# Patient Record
Sex: Female | Born: 1995 | Race: White | Hispanic: No | Marital: Single | Smoking: Never smoker
Health system: Southern US, Community
[De-identification: ages and names within clinical notes are randomized; demographics above are authoritative.]

## PROBLEM LIST (undated history)

## (undated) DIAGNOSIS — G44219 Episodic tension-type headache, not intractable: Secondary | ICD-10-CM

## (undated) DIAGNOSIS — G43009 Migraine without aura, not intractable, without status migrainosus: Secondary | ICD-10-CM

## (undated) DIAGNOSIS — F419 Anxiety disorder, unspecified: Secondary | ICD-10-CM

## (undated) DIAGNOSIS — S060X0A Concussion without loss of consciousness, initial encounter: Secondary | ICD-10-CM

## (undated) DIAGNOSIS — F0781 Postconcussional syndrome: Secondary | ICD-10-CM

## (undated) HISTORY — DX: Anxiety disorder, unspecified: F41.9

## (undated) HISTORY — DX: Episodic tension-type headache, not intractable: G44.219

## (undated) HISTORY — DX: Postconcussional syndrome: F07.81

## (undated) HISTORY — DX: Migraine without aura, not intractable, without status migrainosus: G43.009

## (undated) HISTORY — PX: TYMPANOSTOMY TUBE PLACEMENT: SHX32

## (undated) HISTORY — DX: Concussion without loss of consciousness, initial encounter: S06.0X0A

---
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
## 2013-03-09 ENCOUNTER — Telehealth: Payer: Self-pay

## 2013-03-09 DIAGNOSIS — G43009 Migraine without aura, not intractable, without status migrainosus: Secondary | ICD-10-CM

## 2013-03-09 MED ORDER — SUMATRIPTAN SUCCINATE 50 MG PO TABS: ORAL_TABLET | ORAL | Status: DC

## 2013-03-09 NOTE — Telephone Encounter (Signed)
Rx was sent electronically. TG 

## 2013-03-09 NOTE — Telephone Encounter (Signed)
Darl Pikes, mom, lvm asking for refills on child's Sumatriptan. i called mom and scheduled a f/u visit. Told her to check with pharmacy later today or tomorrow. She expressed understanding.

## 2013-03-26 ENCOUNTER — Encounter: Payer: Self-pay | Admitting: Family

## 2013-03-26 ENCOUNTER — Ambulatory Visit (INDEPENDENT_AMBULATORY_CARE_PROVIDER_SITE_OTHER): Payer: BC Managed Care – PPO | Admitting: Family

## 2013-03-26 VITALS — BP 110/72 | HR 78 | Ht 64.75 in | Wt 130.6 lb

## 2013-03-26 DIAGNOSIS — F0781 Postconcussional syndrome: Secondary | ICD-10-CM

## 2013-03-26 DIAGNOSIS — G43009 Migraine without aura, not intractable, without status migrainosus: Secondary | ICD-10-CM

## 2013-03-26 DIAGNOSIS — S060X0S Concussion without loss of consciousness, sequela: Secondary | ICD-10-CM

## 2013-03-26 DIAGNOSIS — G44219 Episodic tension-type headache, not intractable: Secondary | ICD-10-CM

## 2013-03-26 NOTE — Progress Notes (Signed)
Patient: Whitney Morgan MRN: [id number] Sex: female DOB: 1996/03/03  Provider: Elveria Rising, NP Location of Care: [facility]  Note type: Routine return visit  History of Present Illness: Referral Source: Dr. Loyola Mast History from: patient, [facility] chart and parent. Chief Complaint: Migraines  Whitney Morgan is a 17 y.o. female with history of headaches as well as concussion and post concussion syndrome in September, 2011. MIgraines developed after the closed head injury. [name] and her mother have identified triggers for her headaches as missing meals, not drinking sufficient water, stress and her menstrual cycle. She was started on Zoloft by her primary care physician and feels that it has helped with headaches as well as anxiety.  When Donnice has a migraine, if she takes Sumatriptan at the onset, it tends to abort the migraine fairly quickly. If there is a delay, unfortunately, the migraine builds and she develops vomiting and she has pain and vomiting for hours.Chantavia used to take Topiramate for prevention of migraines but experienced a 10 lb weight loss so the medication was stopped. She tried Riboflavin and Magnesium but has problems with Irritable Bowel Syndrome and could not tolerate them. Her PCP was reluctant for her to try Feverfew or Butterburr because of her history of IBS. When she was last seen in March, 2014, she was interested in trying another preventative as she was experiencing migraines that were interfering with school. She was given a prescription for Atenolol but didn't start it because at the same time, she saw her optometrist and found that she needed glasses. She said that wearing glasses made a big improvement in her headaches.   Unfortunately, Whitney Morgan tells me today that she had a closed head injury 5 weeks ago while playing soccer. She said that she collided with a female player, who knocked her to the ground and her head made contact with the  ground. She said that she did not lose consciousness, was able to sit up but was dizzy. She was assisted to her feet and her position changed to goalie so she would not have to run. She said that had a headache but that she finished the game. That evening she had a headache and vomiting all night. She had pain in the back of her neck and base of her skull. She took Imitrex but it gave no relief. The next morning she tried a second dose and that dose gave some relief. That evening, around 11pm, her roommate noted that her pupils were unequal and she went to local ER. She said that she was assessed and given Ibuprofen 800mg  and sent home. She said that for the next 4 weeks she had frequent severe headaches at base of her skull, but no more vomiting. She had some trouble getting school work done but because of headache pain. She said that the last headache she had was 5 days ago. She is having some pain between her shoulder blades today but says that is muscular in nature and has been present for the past 2 weeks, not since the head injury.  Review of Systems: 12 system review was remarkable for easy bruising, headache, anxiety and decreased energy.  No past medical history on file. Hospitalizations: no, Head Injury: yes, Nervous System Infections: no, Immunizations up to date: yes Past Medical History Comments: Irritable bowel syndrome, migraines, anxiety  Surgical History Past Surgical History  Procedure Laterality Date  . Tonsillectomy  2006  . Tympanostomy tube placement  age 19 months    Family History family history is not on file. Her mother had migraines as a teenager and says that they now occur with menstrual cycles. Family History is negative for seizures, cognitive impairment, blindness, deafness, birth defects, chromosomal disorder, autism.  Social History History   Social History  . Marital Status: Single    Spouse Name: N/A    Number of Children: N/A  . Years of Education:  N/A   Social History Main Topics  . Smoking status: Never Smoker   . Smokeless tobacco: Never Used  . Alcohol Use: No  . Drug Use: No  . Sexual Activity: Not on file   Other Topics Concern  . Not on file   Social History Narrative  . No narrative on file   Educational level: SophomoreSchool Attending: [employer]. Occupation: Consulting civil engineer Living with roommate  Hobbies/Interest: Soccer School comments: doing well in college  Allergies  Allergen Reactions  . Abreva [Docosanol]   . Neosporin [Neomycin-Bacitracin Zn-Polymyx]     Physical Exam BP 110/72  Pulse 78  Ht 5' 4.75" (1.645 m)  Wt 130 lb 9.6 oz (59.24 kg)  BMI 21.89 kg/m2 General: alert, well developed, well nourished, in no acute distress right-handed, blond hair, blue eyes Head: normocephalic, no dysmorphic features Ears, Nose and Throat: Otoscopic: tympanic membranes normal .  Pharynx: oropharynx is pink without exudates or tonsillar hypertrophy. Neck: supple, full range of motion, no cranial or cervical bruits Respiratory: auscultation clear Cardiovascular: no murmurs, pulses are normal Musculoskeletal: no skeletal deformities or apparent scoliosis. She complains of some tightness and soreness in the muscles between her shoulder blades today. Skin: no rashes or neurocutaneous lesions  Neurologic Exam  Mental Status: alert; oriented to person, place, and year; knowledge is normal for age; language is normal Cranial Nerves: visual fields are full to double simultaneous stimuli; extraocular movements are full and conjugate; pupils are round reactive to light; funduscopic examination shows sharp disc margins with normal vessels; symmetric facial strength; midline tongue and uvula;hearing is intact and symmetric Motor: Normal strength, tone, and mass; good fine motor movements; no pronator drift. Sensory: intact responses to touch and temperature Coordination: good finger-to-nose, rapid repetitive  alternating movements and finger apposition   Gait and Station: normal gait and station; patient is able to walk on heels, toes and tandem without difficulty; balance is adequate; Romberg exam is negative; Gower response is negative Reflexes: symmetric and diminished bilaterally; no clonus; bilateral flexor plantar responses   Assessment and Plan Domitila is a 17 year old young woman with history of headaches as well as concussion and post concussion syndrome in September, 2011. She recently had closed head injury again 5 weeks ago while playing soccer. Fortunately her headaches seem to be resolving from this injury. I talked with Konrad Felix about this head injury and about the concerns with repeated head injuries. I will see Madelin back in 6 months or sooner if needed.

## 2013-03-28 ENCOUNTER — Encounter: Payer: Self-pay | Admitting: Family

## 2013-03-28 DIAGNOSIS — G44219 Episodic tension-type headache, not intractable: Secondary | ICD-10-CM | POA: Insufficient documentation

## 2013-03-28 DIAGNOSIS — F0781 Postconcussional syndrome: Secondary | ICD-10-CM | POA: Insufficient documentation

## 2013-03-28 DIAGNOSIS — G43009 Migraine without aura, not intractable, without status migrainosus: Secondary | ICD-10-CM | POA: Insufficient documentation

## 2013-03-28 DIAGNOSIS — S060X0A Concussion without loss of consciousness, initial encounter: Secondary | ICD-10-CM | POA: Insufficient documentation

## 2013-03-28 NOTE — Patient Instructions (Signed)
Continue your medications without change.  Let me know if your headaches from your recent head injury do not improve or if you develop other problems. Please plan to return for follow up in 6 months or sooner if needed.

## 2013-04-20 ENCOUNTER — Other Ambulatory Visit: Payer: Self-pay | Admitting: Family

## 2013-09-28 ENCOUNTER — Other Ambulatory Visit: Payer: Self-pay | Admitting: Family

## 2013-10-18 ENCOUNTER — Encounter: Payer: Self-pay | Admitting: Family

## 2013-10-18 ENCOUNTER — Ambulatory Visit (INDEPENDENT_AMBULATORY_CARE_PROVIDER_SITE_OTHER): Payer: BC Managed Care – PPO | Admitting: Family

## 2013-10-18 VITALS — BP 114/70 | HR 76 | Ht 64.25 in | Wt 132.6 lb

## 2013-10-18 DIAGNOSIS — F0781 Postconcussional syndrome: Secondary | ICD-10-CM

## 2013-10-18 DIAGNOSIS — G44219 Episodic tension-type headache, not intractable: Secondary | ICD-10-CM

## 2013-10-18 DIAGNOSIS — S060X0A Concussion without loss of consciousness, initial encounter: Secondary | ICD-10-CM

## 2013-10-18 DIAGNOSIS — G43009 Migraine without aura, not intractable, without status migrainosus: Secondary | ICD-10-CM

## 2013-10-18 MED ORDER — SUMATRIPTAN SUCCINATE 50 MG PO TABS: ORAL_TABLET | ORAL | Status: DC

## 2013-10-18 NOTE — Progress Notes (Signed)
Patient: Whitney Morgan MRN: [id number] Sex: female DOB: 1996-04-20  Provider: Elveria Morgan, Whitney Yeh, NP Location of Care: [facility]  Note type: Routine return visit  History of Present Illness: Referral Source: Dr. Loyola MastMelissa Morgan History from: patient and her mother Chief Complaint: Migraines  Whitney Morgan is a 18 y.o. young woman with history of tension and migraine headaches. Whitney Morgan also has history of concussion and post concussion syndrome in September, 2011. MIgraines developed after the closed head injury. Unfortunately she had another closed head injury in September of 2014 when she collided with another player during soccer. She had headaches vomiting afterwards, then had ongoing headaches along with some neck and shoulder soreness for several weeks afterwards. Fortunately, she recovered well from that event and has had no further problems. She is no longer playing soccer.   Whitney Morgan and her mother have identified triggers for her headaches as missing meals, not drinking sufficient water, stress and her menstrual cycle. She was started on Zoloft by her primary care physician and feels that it has helped with headaches as well as anxiety. Whitney Morgan is in college in a demanding program at [employer] and tells me today that her headaches have not been problematic since last seen in October 2014. She says that as long as she takes Sumatriptan at the onset, it tends to abort the migraine fairly quickly. She said that she had a migraine several weeks ago that progressed to vomiting but says that she had skipped meals and it occurred on the first day of her menstrual cycle. She says that she has worked her college schedule around meal times and makes sure that she gets plenty of sleep, and overall, she has done well other than that episode.   Review of Systems: 12 system review was remarkable for headaches  Past Medical History  Diagnosis Date  . Migraine without aura,  without mention of intractable migraine without mention of status migrainosus   . Episodic tension type headache   . Post concussion syndrome   . Concussion with no loss of consciousness     September 2011 and September 2014  . Anxiety 2014   Hospitalizations: no, Head Injury: no, Nervous System Infections: no, Immunizations up to date: yes Past Medical History Comments: See hx.  Surgical History Past Surgical History  Procedure Laterality Date  . Tonsillectomy  2006  . Tympanostomy tube placement      age 649 months    Family History family history includes Crohn's disease in her father. Family History is otherwise negative for migraines, seizures, cognitive impairment, blindness, deafness, birth defects, chromosomal disorder, autism.  Social History History   Social History  . Marital Status: Single    Spouse Name: N/A    Number of Children: N/A  . Years of Education: N/A   Social History Main Topics  . Smoking status: Never Smoker   . Smokeless tobacco: Never Used  . Alcohol Use: No  . Drug Use: No  . Sexual Activity: No   Other Topics Concern  . None   Social History Narrative  . None   Educational level: university School Attending:[employer]. 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 Living with:  room-mate(during school)  Hobbies/Interest: Reading School comments:  Whitney Morgan is doing well in school. She is also a [employer] this summer.  Physical Exam BP 114/70  Pulse 76  Ht 5' 4.25" (1.632 m)  Wt 132 lb 9.6 oz (60.147 kg)  BMI 22.58 kg/m2  LMP 10/16/2013 General: alert, well developed, well nourished, in no acute distress right-handed, blond hair, blue eyes  Head: normocephalic, no dysmorphic features  Ears, Nose and Throat: Otoscopic: tympanic membranes normal . Pharynx: oropharynx is pink without exudates or tonsillar hypertrophy.  Neck: supple, full range of motion, no cranial or cervical bruits  Respiratory:  auscultation clear  Cardiovascular: no murmurs, pulses are normal  Musculoskeletal: no skeletal deformities or apparent scoliosis.  Skin: no rashes or neurocutaneous lesions   Neurologic Exam  Mental Status: alert; oriented to person, place, and year; knowledge is normal for age; language is normal  Cranial Nerves: visual fields are full to double simultaneous stimuli; extraocular movements are full and conjugate; pupils are round reactive to light; funduscopic examination shows sharp disc margins with normal vessels; symmetric facial strength; midline tongue and uvula;hearing is intact and symmetric  Motor: Normal strength, tone, and mass; good fine motor movements; no pronator drift.  Sensory: intact responses to touch and temperature  Coordination: good finger-to-nose, rapid repetitive alternating movements and finger apposition  Gait and Station: normal gait and station; patient is able to walk on heels, toes and tandem without difficulty; balance is adequate; Romberg exam is negative; Gower response is negative  Reflexes: symmetric and diminished bilaterally; no clonus; bilateral flexor plantar responses   Assessment and Plan Whitney Morgan is a 18 year old young woman with history of headaches as well as concussion and post concussion syndrome in September, 2011. She had a repeat closed head injury in September 2014 but has recovered well from that. Whitney Morgan is managing her headaches well at this time. I talked with her about stress management, and continuing to make good choices in terms of lifestyle - not skipping meals, being well hydrated and getting adequate sleep. I will see Whitney Morgan back in follow up when she returns from college in December or sooner if needed.

## 2013-10-20 ENCOUNTER — Encounter: Payer: Self-pay | Admitting: Family

## 2013-10-20 NOTE — Patient Instructions (Signed)
Continue your medications without change. Let me know if your headaches worsen or if you have other concerns. Please plan to return for follow up in December 2015 when you return from college for winter break.

## 2013-12-15 ENCOUNTER — Telehealth: Payer: Self-pay | Admitting: Family

## 2013-12-15 NOTE — Telephone Encounter (Signed)
[facility] [place], [place] is in [place] that her insurance required prior authorization for quantity limit exception for Sumatriptan. In reviewing her record, I did not see that she would need that so I called Whitney Morgan to get more information and left a message for her to call me back. Her mother Whitney Morgan called me and said that Whitney Morgan was in class and had asked her to call me instead. She said that Whitney Morgan had left her medication at home and had tried to [place] in [place], which is likely what prompted the pharmacy issue. She said that as far as she knew that Whitney Morgan had not exceeded more than 6 tablets per month. However, she said that Whitney Morgan had asked her to talk with me about her increase in severe headaches around her menstrual cycles. She said that for the last couple of months that Whitney Morgan had severe migraines with vomiting on the 1st and 2nd day of her period. She took Sumatriptan and Tylenol with those migraines but still had vomiting. Whitney Morgan was interested in knowing if a birth control pill would help to regulate her periods and perhaps minimize the migraines. Mom said that Whitney Morgan had some other migraines during the month but that they were not as severe. She said that she had talked with Whitney Morgan about a headache that she had yesterday in which she did not have medication with her and there was a delay in treatment. I talked with Mom about this and asked her to also remind Whitney Morgan about the need for her to avoid skipping meals and to be adequately hydrated. Mom also asked if Whitney Morgan could reconsider starting Atenolol if she was having more migraines during the month as well as around her period and I said that we could, but that we needed for her to keep track of her headaches so that we could determine how many she was having and if the medication was benefiting her. We also talked about increasing the monthly allotment of Sumatriptan from 6  tablets per month to 8 tablets per month. I agreed with that but again stressed the need to know how many migraines Whitney Morgan was experiencing. Mom agreed and said that she would talk with Whitney Morgan this evening and call me back tomorrow. Whitney Morgan

## 2013-12-16 NOTE — Telephone Encounter (Signed)
The mother called Inetta Fermoina back. She stated she wanted the pt to try the pill and will the family doctor for the pt to be seen or the GYN. The mother will let you know about that. The mother mentioned Imitrex - the pt  would misuse the medication because of the severity of her headaches, mostly around her menstral cycle. Can you can call  [facility] and can pick up the medication tomorrow and take it to the pt. She can be reached at [phone].

## 2013-12-17 MED ORDER — SUMATRIPTAN SUCCINATE 50 MG PO TABS: ORAL_TABLET | ORAL | Status: DC

## 2013-12-17 NOTE — Telephone Encounter (Signed)
I sent updated Rx to [facility]. TG

## 2013-12-22 ENCOUNTER — Telehealth: Payer: Self-pay | Admitting: *Deleted

## 2013-12-22 NOTE — Telephone Encounter (Signed)
I reviewed your notes and agree with this plan. 

## 2013-12-22 NOTE — Telephone Encounter (Signed)
[name], mom, stated the pt is still having headaches and getting worse. She stated that the pt and herself went to see their family doctor on 11/20/13. The pt was prescribed birth control, Mircette. The mother stated the pt started taking the birth control on Monday. The mother stated the pt is no longer feeling the headaches on the forehead area and on the face. The headache has moved to the top back of her head. The mother stated the pt is getting headaches daily. The mother said that the pt is [place] the end of this month. The pt is staying with the mother's in-law's. The mother stated that the pt is not eating "great", not exercising, and is trying to stay hydrated. The mother stated the pt alternates with advil and tylenol, but no relief. The mother would like to know if she should give the birth control some time to work or should she give the pt something else for her headaches. The mother also wanted to know if the pt should come in to see you. The mother can be reached at [phone]

## 2013-12-22 NOTE — Telephone Encounter (Signed)
I called Mom. She said that [facility] of headaches every day, some worse than others. Yesterday she left class to vomit, then returned to class because she had a presentation to do. She said that [name] denies being stressed but says that she doesn't like the teacher or the class. The headaches have been worse since she has been back to school. Her father wonders if she is depressed because he feels that she sleeps too much and has a negative demeanor (says nothing is every good etc) and wants her to go to counseling. Mom said that she is eating meals because she is staying with grandparents, and that she says that she is drinking water. She has not been exercising. I talked with Mom about the daily Advil or Tylenol and told her about medication rebound headaches. I told her that we could try low dose Tizanidine to help her to stop taking so much daily analgesic medication if she had headaches that prevented her from sleep at night but that she needed to stop taking the medication every day. I told her that [name] was likely more stressed with school than she was aware or wanted to admit, and that she might be depressed. Counseling may help. She is on Setraline. She may need to be on migraine preventative but [name] has been resistant to that in the past. I told Mom that [facility] needed to come in for follow up so that we could make a treatment plan. Mom said that she would talk with [facility] and call me back as [name] may not be willing to come in until she finishes her semester at the end of this month. I told Mom that I would be willing to work [employer] in at a time that she could come in.  TG

## 2014-01-07 ENCOUNTER — Telehealth: Payer: Self-pay | Admitting: Family

## 2014-01-07 MED ORDER — SUMATRIPTAN SUCCINATE 50 MG PO TABS: ORAL_TABLET | ORAL | Status: DC

## 2014-01-07 NOTE — Telephone Encounter (Signed)
Mom Whitney HeftySusie Morgan called with update on [place]atelyn. See previous phone calls 12/15/13 and 12/22/13. Mom talked with her and told her not to take so much OTC pain relievers as I had advised. She said that [facility] tolerated that. She is having about 2 bad headaches per week.  She is due to have her menstrual period next week and both she and Mom are anxious to see how migraines are with that now that she is on a birth control pill. Mom said that Whitney FelixKatelyn has only been given 6 Imitrex per month in the past and that she and I talked about increasing that to 8 per month in a prior conversation. Mom wants to consider that now that Whitney FelixKatelyn is taking fewer OTC pain relievers and having 2 bad headaches per week. Mom's number is [phone]. TG

## 2014-01-07 NOTE — Telephone Encounter (Signed)
I reviewed your notes and agree with this plan. 

## 2014-01-07 NOTE — Telephone Encounter (Signed)
I called and talked to Mom. I told her that I would be willing to increase her Imitrex month's supply to 8 tablets but that I was concerned about the number of migraines that Whitney Morgan is having overall. I told Mom that while Whitney Morgan has been resistent to taking a preventative medication, that she needs to reconsider that and needs to come in for an appointment in the next week or so. Mom agreed, and said that she would talk with her this weekend and call me next week to schedule. She said that Whitney Morgan would have a week or so off between summer and fall semester and moving into a new apartment, and [facility] would [facility]. I will send in updated Rx now and await Mom's call back next week. TG

## 2014-01-17 NOTE — Telephone Encounter (Signed)
I called and scheduled Whitney Morgan for an appointment Weds August 12th @ 8:45AM, to arrive @ 8:30AM. TG

## 2014-01-17 NOTE — Telephone Encounter (Signed)
[name], mom, stated the pt is home today through Thursday at noon. The mother stated she would like the pt to be seen this week. The pt will start school next Tuesday and also has fridays off. The mother can be reached at [phone].

## 2014-01-19 ENCOUNTER — Ambulatory Visit (INDEPENDENT_AMBULATORY_CARE_PROVIDER_SITE_OTHER): Payer: BC Managed Care – PPO | Admitting: Family

## 2014-01-19 ENCOUNTER — Encounter: Payer: Self-pay | Admitting: Family

## 2014-01-19 VITALS — BP 110/70 | HR 74 | Ht 64.5 in | Wt 137.2 lb

## 2014-01-19 DIAGNOSIS — S060X0D Concussion without loss of consciousness, subsequent encounter: Secondary | ICD-10-CM

## 2014-01-19 DIAGNOSIS — S060X0A Concussion without loss of consciousness, initial encounter: Secondary | ICD-10-CM

## 2014-01-19 DIAGNOSIS — G44219 Episodic tension-type headache, not intractable: Secondary | ICD-10-CM

## 2014-01-19 DIAGNOSIS — G43009 Migraine without aura, not intractable, without status migrainosus: Secondary | ICD-10-CM

## 2014-01-19 DIAGNOSIS — Z5189 Encounter for other specified aftercare: Secondary | ICD-10-CM

## 2014-01-19 DIAGNOSIS — F0781 Postconcussional syndrome: Secondary | ICD-10-CM

## 2014-01-19 MED ORDER — TIZANIDINE HCL 4 MG PO TABS: ORAL_TABLET | ORAL | Status: DC

## 2014-01-19 MED ORDER — QUDEXY XR 25 MG PO CS24: EXTENDED_RELEASE_CAPSULE | ORAL | Status: DC

## 2014-01-19 MED ORDER — ONDANSETRON HCL 4 MG PO TABS: 4.0000 mg | ORAL_TABLET | Freq: Three times a day (TID) | ORAL | Status: DC | PRN

## 2014-01-19 NOTE — Patient Instructions (Addendum)
I have given you a prescription for Qudexy XR. This is extended release Topiramate. Take 1 per day and let me know if you have side effects as well as how your headaches respond. We may need to adjust the dose if you tolerate the medication. I have given you a copay card for the first month free. After that, we can switch to generic Topiramate ER or use the other copay card and continue on the brand Qudexy, depending on your insurance coverage.   I have given you a prescription for Tizanidine 4mg . Take 1/2 to 1 tablet at bedtime when you have a migraine that keeps you awake at night. This will make you sleepy if you take it during the day, so do not take it during the day if you have to drive or go to class, etc. You can take it with other medications such as Imitrex, Excedrin, Ondansetron etc and it will not interfere.   I have given you a prescription for Ondansetron for nausea to take with you have a migraine with nausea.   Keep track of your headaches so that we can determine if the Topiramate is giving you benefit.  Call me and let me know by the end of the first month how you are doing.   I will see you back in follow up when you are home for winter break or sooner if needed.

## 2014-01-19 NOTE — Progress Notes (Signed)
Patient: Whitney Morgan MRN: [id number] Sex: female DOB: 12/26/1995  Provider: Elveria Rising, NP Location of Care: [facility]  Note type: Routine return visit  History of Present Illness: Referral Source: Dr. Loyola Mast History from: patient and her mother Chief Complaint: Headaches  Whitney Morgan is a 18 y.o. young woman with history of tension and migraine headaches. Dahlia also has history of concussion and post concussion syndrome in September, 2011, with migraines developing after the closed head injury. She had a second head injury in September 2014 when she collided with another player during soccer. She had headaches and vomiting afterwards, then ongoing headaches, neck and shoulder soreness for several weeks afterwards. She was last seen Oct 18, 2013.   Whitney Morgan returns today for follow up because she has had increase in migraines this summer. She has been having migraines on average 3 times per week that includes vomiting. She has had other migraines that are less severe, as well as daily tension headaches. She has been attending summer school at [employer], and denies that it was a stressful experience. She lived with her grandparents this summer, had regular meals, says that she drank enough water and got adequate sleep. Whitney Morgan has had migraines associated with her menstrual cycle for some time, and she began taking an oral contraceptive this summer. Her mother called me several times this summer to report that Whitney Morgan was having increased migraines and taking Ibuprofen or Aleve almost daily to deal with headaches. Her parents wondered if she was depressed, because she was low energy and had a generally negative demeanor. Whitney Morgan has been taking Sertraline for several months, prescribed by her pediatrician, and denies being depressed. She says that she is tired from the rigors of school and feels stressed. She says that the Sertraline has helped with  that. Whitney Morgan said that she went to the [facility] and exercised this summer most days.   Today Whitney Morgan says that she has had a headache for the past 5 days. She says that it is largely on the top and back of her head, is a pressure sensation, and has not changed in quality. She has no other associated symptoms with it such as visual disturbance or dizziness.The pain has kept her awake at night. She was nauseated one night but did not vomit. She tried Sumatriptan once and had some dulling of the pain for about 30 minutes but the pain returned. She did not repeat the dose. She tried Excedrin Migraine another time and that also gave some dulling but the pain returned. [name] notes that she had her menstrual cycle last week, and that her last severe migraine was last week during that, but Excedrin Migraine aborted the migraine within 1 hour.   Whitney Morgan and her mother are interested in her starting a migraine preventative. She took Topiramate in the past and it resulted in improvement in her headaches but she had side effects of nausea and loss of appetite. She experienced a 10 lb weight loss in 1 semester, and the medication was stopped. She tried Riboflavin and Magnesium, but had problems with tolerance due to her history of Irritable Bowel Syndrome. Atenolol was once considered for her but she decided against it at the time because her headaches improved.    Review of Systems: 12 system review was unremarkable  Past Medical History  Diagnosis Date  . Migraine without aura, without mention of intractable migraine without mention of status migrainosus   . Episodic tension type headache   .  Post concussion syndrome   . Concussion with no loss of consciousness     September 2011 and September 2014  . Anxiety 2014   Hospitalizations: No., Head Injury: No., Nervous System Infections: No., Immunizations up to date: Yes.   Past Medical History Comments: see Hx.  Surgical History Past Surgical History   Procedure Laterality Date  . Tonsillectomy  2006  . Tympanostomy tube placement      age 19 months    Family History family history includes Crohn's disease in her father. Family History is otherwise negative for migraines, seizures, cognitive impairment, blindness, deafness, birth defects, chromosomal disorder, autism.  Social History History   Social History  . Marital Status: Single    Spouse Name: N/A    Number of Children: N/A  . Years of Education: N/A   Social History Main Topics  . Smoking status: Never Smoker   . Smokeless tobacco: Never Used  . Alcohol Use: No  . Drug Use: No  . Sexual Activity: No   Other Topics Concern  . None   Social History Narrative  . 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  Physical Exam BP 110/70  Pulse 74  Ht 5' 4.5" (1.638 m)  Wt 137 lb 3.2 oz (62.234 kg)  BMI 23.20 kg/m2  LMP 01/10/2014 General: well developed, well nourished young woman, seated on exam table, in no evident distress Head: head normocephalic and atraumatic.  Oropharynx benign. Neck: supple with no carotid or supraclavicular bruits Cardiovascular: regular rate and rhythm, no murmurs Skin: No rashes or lesions  Neurologic Exam Mental Status: Awake and fully alert.  Oriented to place and time.  Recent and remote memory intact.  Attention span, concentration, and fund of knowledge appropriate.  Mood and affect appropriate. She complains of headache today. Cranial Nerves: Fundoscopic exam reveals sharp disc margins.  Pupils equal, briskly reactive to light.  Extraocular movements full without nystagmus.  Visual fields full to confrontation.  Hearing intact and symmetric to finger rub.  Facial sensation intact.  Face tongue, palate move normally and  symmetrically.  Neck flexion and extension normal. Motor: Normal bulk and tone. Normal strength in all tested extremity muscles. Sensory: Intact to touch and temperature in all extremities.  Coordination: Rapid alternating movements normal in all extremities.  Finger-to-nose and heel-to shin performed accurately bilaterally.  Romberg negative. Gait and Station: Arises from chair without difficulty.  Stance is normal. Gait demonstrates normal stride length and balance.   Able to heel, toe and tandem walk without difficulty. Reflexes: Diminished and symmetric. Toes downgoing.  Assessment and Plan Carleta is a 18 year old young woman with history of headaches as well as concussion and post concussion syndrome in September, 2011. She had a repeat closed head injury in September 2014 but has recovered well from that. Alisse has not been taking a preventative medication because her migraines have not been problematic but over the summer her migraines have increased in frequency and severity. She is now having migraines at least 3 times per week that include vomiting. We discussed medications used for migraine prevention, and after discussion, Payton elected to try Topiramate again, but this time try the extended release formulation. I asked her to call me in a few weeks and let me know how she was doing. We may need to adjust her  dose. I gave her Ondansetron for nausea and Tizanidine for migraine pain that keeps her awake at night. I am concerned about the amount of Ibuprofen and Aleve she is taking and talked with her about rebound headache. I asked [name] to keep track of her headaches so that we can determine if the headaches are improving on the Topiramate ER.  I will see her back in follow up in December when she is at home for winter break, but will be in touch with her by phone in the interim, and will see her sooner if needed. Arlisha and her mother agreed with these plans.

## 2014-02-01 ENCOUNTER — Telehealth: Payer: Self-pay | Admitting: Family

## 2014-02-01 MED ORDER — DIVALPROEX SODIUM ER 250 MG PO TB24: ORAL_TABLET | ORAL | Status: DC

## 2014-02-01 NOTE — Telephone Encounter (Signed)
Whitney Morgan called and left a message saying that Whitney Morgan had continued to have headaches since her appointment here 2 weeks ago and since starting Qudexy. Then last night she had a severe migraine and called Whitney crying from college. She said that she might as well die because of ongoing headaches, so Whitney is alarmed. I called Whitney, then called Whitney Morgan at [phone]. She said that she has continued to have migraine pain every day since she was seen on 01/19/14. She said that the pain varied in intensity but was present every day. She said that she became nauseated at least twice per day and has vomited every day. She has had nausea for at least 3-4 hours per day since that visit. Whitney Morgan believes that it is from the Qudexy because she was not nauseated before starting it, she was just having migraine pain. Then last night, the migraine worsened substantially and the pain became intolerable. She had more vomiting. She was alone in her apartment and called her mother. She said that she made the comment about dying because she is tired of having headaches every day and her activities being limited by headaches.She denies feeling sad in general and denies any intention to harm herself. Whitney Morgan also said that since has had a right side headache or feeling of pressure for 2-3 weeks. She says that this is forehead, cheek, upper teeth and ear. This became really intolerable to her too, on top of the migraine and she went to the infirmary at school a couple of weeks ago. She was treated with antibiotics for sinus and ear infection. She said that while she was taking the antibiotic that the pain largely resolved but now is back. Whitney Morgan tried Tizanidine  - 1/2 tablet once since she was last seen and felt that she had worsening of nausea and vomiting so she has not taken more. She takes Imitrex  2x per week for migraine and takes Tylenol or Advil at other times if pain is intolerable. I told her that i would talk  to Dr Sharene Skeans and call her back today or tomorrow. TG

## 2014-02-01 NOTE — Telephone Encounter (Signed)
I discussed Molly's situation with Dr Sharene Skeans. We will stop Qudexy, let the nausea settle down, and then start Depakote ER . We will increase in a week to 2 tablets at bedtime if Berneta tolerates it. We had talked about this medication at her last visit, and I reminded her of potential side effects of increased appetite and weight gain as well as warned her about risk of teratogenicity in early pregnancy. I talked to her about going to local ER for treatment if she has another migraine like she had last night and she agreed with that plan. I sent in the Rx for Depakote ER. TG

## 2014-05-24 ENCOUNTER — Encounter: Payer: Self-pay | Admitting: Family

## 2014-05-24 ENCOUNTER — Ambulatory Visit (INDEPENDENT_AMBULATORY_CARE_PROVIDER_SITE_OTHER): Payer: BC Managed Care – PPO | Admitting: Family

## 2014-05-24 VITALS — BP 112/72 | HR 70 | Ht 64.0 in | Wt 141.0 lb

## 2014-05-24 DIAGNOSIS — G43009 Migraine without aura, not intractable, without status migrainosus: Secondary | ICD-10-CM

## 2014-05-24 DIAGNOSIS — G44219 Episodic tension-type headache, not intractable: Secondary | ICD-10-CM

## 2014-05-24 MED ORDER — SUMATRIPTAN SUCCINATE 50 MG PO TABS: ORAL_TABLET | ORAL | Status: DC

## 2014-05-24 MED ORDER — DIVALPROEX SODIUM ER 250 MG PO TB24: ORAL_TABLET | ORAL | Status: DC

## 2014-05-24 NOTE — Patient Instructions (Signed)
Continue your medication without change for now. If you have increase in migraine frequency or severity let me know.  Remember that Divalproex should not be taken by pregnant women. It is important to use dependable birth control while on this medication. I will give you a blood test order at your next visit to check your blood count and liver.   Call me if you have any questions or concerns. I will see you back in follow up in May 2015 or sooner if needed.

## 2014-05-24 NOTE — Progress Notes (Signed)
Patient: Whitney Morgan MRN: [id number] Sex: female DOB: [birth year]  Provider: Elveria RisingGOODPASTURE, Thula Stewart, NP Location of Care: [facility]  Note type: Routine return visit  History of Present Illness: Referral Source: Dr. Loyola MastMelissa Lowe History from: patient and her mother Chief Complaint: Headaches  Whitney CarolinaKatelyn E Lacina is a 18 y.o. young woman with history of tension and migraine headaches. Whitney Morgan also has history of concussion and post concussion syndrome in September, 2011, with migraines developing after the closed head injury. She had a second head injury in September 2014 when she collided with another player during soccer. She had headaches and vomiting afterwards, then ongoing headaches, neck and shoulder soreness for several weeks afterwards. She was last seen January 19, 2014.   When she was last seen, Whitney Morgan had experienced an increase in migraines over the summer, and wanted to try a preventative medication. She was started on Qudexy XR 25mg  daily. Her mother called me on February 01, 2014 to report that Whitney Morgan was had an increase in migraines and was extremely nauseated. She was vomiting at least twice per day since starting the Qudexy.  That medication was stopped and when her nausea resolved, she was started on Divalproex ER. She has tolerated that well and is currently taking 500mg  at bedtime. She reports an overall decrease in migraines. She is not keeping a headache diary but tells me today that she had 3 migraines and 2 tension headaches in November. She says that one migraine was severe and that she had to repeat the Sumatriptan dose in 2 hours to obtain relief. Thus far in December she says that she has had 3 migraines this month but believes that 2 were directly related to stress. With one she had a 8 hour oral presentation to do, and with the other, she had cut her finger with a band saw and fainted while the wound was being cleaned. She had a migraine after fainting.    Whitney Morgan has been otherwise healthy. She is doing well in school and feels that next semester will be less stressful. Whitney Morgan satisfied with her current migraine treatment plan for now.   Review of Systems: 12 system review was remarkable for migraines  Past Medical History  Diagnosis Date  . Migraine without aura, without mention of intractable migraine without mention of status migrainosus   . Episodic tension type headache   . Post concussion syndrome   . Concussion with no loss of consciousness     September 2011 and September 2014  . Anxiety 2014   Hospitalizations: No., Head Injury: No., Nervous System Infections: No., Immunizations up to date: Yes.   Past Medical History Comments: see Hx.  Surgical History Past Surgical History  Procedure Laterality Date  . Tonsillectomy  2006  . Tympanostomy tube placement      age 659 months    Family History family history includes Crohn's disease in her father. Family History is otherwise negative for migraines, seizures, cognitive impairment, blindness, deafness, birth defects, chromosomal disorder, autism.  Social History History   Social History  . Marital Status: Single    Spouse Name: N/A    Number of Children: N/A  . Years of Education: N/A   Social History Main Topics  . Smoking status: Never Smoker   . Smokeless tobacco: Never Used  . Alcohol Use: No  . Drug Use: No  . Sexual Activity: No   Other Topics Concern  . None   Social History Narrative   Educational level: university  School Attending:[employer] Living with:  roommate/parents  Hobbies/Interest: Publishing copyurniture Design School comments:  Whitney Morgan is doing well in school. 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  Physical Exam BP 112/72 mmHg  Pulse 70  Ht 5\' 4"  (1.626 m)  Wt 141 lb (63.957 kg)  BMI 24.19 kg/m2  LMP 05/03/2014 (Approximate) General: well developed, well nourished young woman, seated on exam table, in no evident distress Head: head  normocephalic and atraumatic. Oropharynx benign. Neck: supple with no carotid or supraclavicular bruits Cardiovascular: regular rate and rhythm, no murmurs Skin: No rashes or lesions. She has a healing wound on her right 5th finger from a laceration received when using a band saw.  Neurologic Exam Mental Status: Awake and fully alert. Oriented to place and time. Recent and remote memory intact. Attention span, concentration, and fund of knowledge appropriate. Mood and affect appropriate. Cranial Nerves: Fundoscopic exam reveals sharp disc margins. Pupils equal, briskly reactive to light. Extraocular movements full without nystagmus. Visual fields full to confrontation. Hearing intact and symmetric to finger rub. Facial sensation intact. Face tongue, palate move normally and symmetrically. Neck flexion and extension normal. Motor: Normal bulk and tone. Normal strength in all tested extremity muscles. Sensory: Intact to touch and temperature in all extremities.  Coordination: Rapid alternating movements normal in all extremities. Finger-to-nose and heel-to shin performed accurately bilaterally. Romberg negative. Gait and Station: Arises from chair without difficulty. Stance is normal. Gait demonstrates normal stride length and balance. Able to heel, toe and tandem walk without difficulty. Reflexes: Diminished and symmetric. Toes downgoing.  Assessment and Plan Whitney Morgan is a 18 year old young woman with history of headaches as well as concussion and post concussion syndrome in September, 2011. She had a repeat closed head injury in September 2014 but has recovered well from that. Whitney Morgan is taking and tolerating Divalproex ER 500mg  for migraine prevention, and has had improvement in her headaches. She is satisfied with the current treatment plan and does not want to make changes at this time. Whitney Morgan was reminded that Divalproex should not be taken by pregnant women and to use dependable  birth control while taking this medication. I also told her that I will give her a blood test order to check CBC and liver enzymes at her next visit  I will see her back in follow up in May 2015. Saveah and her mother agreed with these plans.

## 2014-06-20 ENCOUNTER — Telehealth: Payer: Self-pay | Admitting: *Deleted

## 2014-06-20 MED ORDER — DEPAKOTE ER 250 MG PO TB24: ORAL_TABLET | ORAL | Status: DC

## 2014-06-20 NOTE — Telephone Encounter (Signed)
Whitney HeftySusie Morgan, mother, stated that last year, they reached their deductible for the year and did not have to pay for the depakote. And when the pt went to pick it up yesterday, she was charged over $100.00 because they have a bad HSA plan. The mother said that she went online and the pharmaceutical company has a discount card but its for brand name. Would there be a discount card for generic? The pt did purchase the medication. The mother would like to keep her costs down. The mother can be reached at [phone].

## 2014-06-21 NOTE — Telephone Encounter (Signed)
I called Mom yesterday afternoon and explained that there was not a discount card for generic medication. I suggested sending in an Rx for the brand name, and trying the copay card that she found online. Mom agreed with this plan and will let me know how it works. I [place] TG

## 2014-07-29 ENCOUNTER — Other Ambulatory Visit: Payer: Self-pay | Admitting: Family Medicine

## 2014-07-29 ENCOUNTER — Ambulatory Visit
Admission: RE | Admit: 2014-07-29 | Discharge: 2014-07-29 | Disposition: A | Discharge status: Home / Self Care | Payer: BLUE CROSS/BLUE SHIELD | Source: Ambulatory Visit | Attending: Family Medicine | Admitting: Family Medicine

## 2014-07-29 DIAGNOSIS — M545 Low back pain, unspecified: Secondary | ICD-10-CM

## 2014-10-20 ENCOUNTER — Encounter: Payer: Self-pay | Admitting: Family

## 2014-12-02 ENCOUNTER — Other Ambulatory Visit: Payer: Self-pay | Admitting: Family

## 2014-12-02 DIAGNOSIS — G43009 Migraine without aura, not intractable, without status migrainosus: Secondary | ICD-10-CM

## 2014-12-02 MED ORDER — SUMATRIPTAN SUCCINATE 50 MG PO TABS: ORAL_TABLET | ORAL | Status: DC

## 2014-12-09 ENCOUNTER — Ambulatory Visit: Payer: Self-pay | Admitting: Family

## 2014-12-23 ENCOUNTER — Ambulatory Visit (INDEPENDENT_AMBULATORY_CARE_PROVIDER_SITE_OTHER): Payer: BLUE CROSS/BLUE SHIELD | Admitting: Family

## 2014-12-23 ENCOUNTER — Encounter: Payer: Self-pay | Admitting: Family

## 2014-12-23 VITALS — BP 108/70 | HR 72 | Ht 64.75 in | Wt 145.0 lb

## 2014-12-23 DIAGNOSIS — G44219 Episodic tension-type headache, not intractable: Secondary | ICD-10-CM | POA: Diagnosis not present

## 2014-12-23 DIAGNOSIS — F0781 Postconcussional syndrome: Secondary | ICD-10-CM | POA: Diagnosis not present

## 2014-12-23 DIAGNOSIS — G43009 Migraine without aura, not intractable, without status migrainosus: Secondary | ICD-10-CM | POA: Diagnosis not present

## 2014-12-23 MED ORDER — DEPAKOTE ER 250 MG PO TB24: ORAL_TABLET | ORAL | Status: DC

## 2014-12-23 MED ORDER — SUMATRIPTAN SUCCINATE 50 MG PO TABS: ORAL_TABLET | ORAL | Status: DC

## 2014-12-23 NOTE — Progress Notes (Signed)
Patient: Whitney Morgan MRN: [id number] Sex: female DOB: 07/17/1995  Provider: Elveria Rising, NP Location of Care: [facility]  Note type: Routine return visit  History of Present Illness: Referral Source: Dr. Loyola Mast History from: mother and patient Chief Complaint: Headaches  Whitney Morgan is a 19 y.o. young woman with history of tension and migraine headaches. Whitney Morgan also has history of concussion and post concussion syndrome in September, 2011, with migraines developing after the closed head injury. She had a second head injury in September 2014 when she collided with another player during soccer. She had headaches and vomiting afterwards, then ongoing headaches, neck and shoulder soreness for several weeks afterwards. Whitney Morgan is taking and tolerating Depakote ER for migraine prevention. She was last seen May 24, 2014.   Today Whitney Morgan reports that she has been experiencing few migraines since being on Depakote ER. When she has a migraine, she says that they less severe and that she is able to get relief quicker than in the past. Her mother also feels that Whitney Morgan is doing better at treating the migraine as soon as it begins. Whitney Morgan had 3 migraines in the last 10 days, but attributes that the being on a cruise, getting a sunburn and being exposed to heat every day. Her mother also wonders if some of the migraines could have been triggered by motion of the ship, as she said that the motion bothered her more than usual.   Whitney Morgan had questions today regarding her birth control pill and about being fatigued. She and her mother say that since being on the birth control pill that she is more tired than usual. She recently had a period that lasted over a month, and her birth control pill was changed at that time. She and Whitney Morgan said that she had blood tests at that time and was told that she is not anemic. Whitney Morgan would like to stop the pills, but wonders if  her headaches would worsen. She and her mother are concerned because she is tired every day and requires about 10 hours of sleep at night. 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 She said that she perspires heavily and drinks water all day. When she gets home from work, she is exhausted and has to force herself to stay awake until bedtime. She also says that she is tired on school days and often naps if the opportunity is available. She goes to bed by 930 or 10PM each night and sleeps until 7AM both this summer and while away at college.   Finally, Whitney Morgan asks if she could stop taking Zoloft. She has been taking it since her freshman year and it has helped significantly with anxiety. Mom wonders if it is helping with headache prevention and is reluctant for her to stop it.   Whitney Morgan has been otherwise healthy since last seen. She will be starting her senior year of college in the fall and feels that it will be less stressful. 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 Neither Whitney Morgan nor her mother have other health concerns today other than previously mentioned.  Review of Systems: Please see the HPI for neurologic and other pertinent review of systems. Otherwise, the following systems are noncontributory including constitutional, eyes, ears, nose and throat, cardiovascular, respiratory, gastrointestinal, genitourinary, musculoskeletal, skin, endocrine, hematologic/lymph, allergic/immunologic  and psychiatric.   Past Medical History  Diagnosis Date  . Migraine without aura, without mention of intractable migraine without mention of status migrainosus   . Episodic tension type headache   . Post concussion syndrome   . Concussion with no loss of consciousness     September 2011 and September 2014  . Anxiety 2014    Hospitalizations: No., Head Injury: No., Nervous System Infections: No., Immunizations up to date: Yes.   Past Medical History Comments: See history  Surgical History Past Surgical History  Procedure Laterality Date  . Tonsillectomy  2006  . Tympanostomy tube placement      age 21 months    Family History family history includes Crohn's disease in her father. Family History is otherwise negative for migraines, seizures, cognitive impairment, blindness, deafness, birth defects, chromosomal disorder, autism.  Social History History   Social History  . Marital Status: Single    Spouse Name: N/A  . Number of Children: N/A  . Years of Education: N/A   Social History Main Topics  . Smoking status: Never Smoker   . Smokeless tobacco: Never Used  . Alcohol Use: No  . Drug Use: No  . Sexual Activity: No   Other Topics Concern  . None   Social History Narrative   Educational levelClinical biochemist Attending:[employer] Living with:  roommate  Hobbies/Interest: Britlee enjoys working in [employer], Careers adviser, and playing cards. School comments:  Anhthu does well in school.  Allergies Allergies  Allergen Reactions  . Abreva [Docosanol]   . Neosporin [Neomycin-Bacitracin Zn-Polymyx]     Physical Exam BP 108/70 mmHg  Pulse 72  Ht 5' 4.75" (1.645 m)  Wt 145 lb (65.772 kg)  BMI 24.31 kg/m2  LMP 12/09/2014 (Approximate)  General: well developed, well nourished young woman, seated on exam table, in no evident distress Head: head normocephalic and atraumatic. Oropharynx benign. Neck: supple with no carotid or supraclavicular bruits Cardiovascular: regular rate and rhythm, no murmurs Skin: No rashes or lesions.   Neurologic Exam Mental Status: Awake and fully alert. Oriented to place and time. Recent and remote memory intact. Attention span, concentration, and fund of knowledge appropriate. Mood and affect appropriate. Cranial Nerves: Fundoscopic  exam reveals sharp disc margins. Pupils equal, briskly reactive to light. Extraocular movements full without nystagmus. Visual fields full to confrontation. Hearing intact and symmetric to finger rub. Facial sensation intact. Face tongue, palate move normally and symmetrically. Neck flexion and extension normal. Motor: Normal bulk and tone. Normal strength in all tested extremity muscles. Sensory: Intact to touch and temperature in all extremities.  Coordination: Rapid alternating movements normal in all extremities. Finger-to-nose and heel-to shin performed accurately bilaterally. Romberg negative. Gait and Station: Arises from chair without difficulty. Stance is normal. Gait demonstrates normal stride length and balance. Able to heel, toe and tandem walk without difficulty. Reflexes: Diminished and symmetric. Toes downgoing  Impression 1. Migraine without aura, not intractable 2. Episodic tension headaches 3. History of anxiety 4. Fatigue  Recommendations for plan of care The patient's previous [facility] records were reviewed. Rachelann has neither had nor required imaging or lab studies since the last visit. She is a 19 year old young woman with history of headaches as well as concussion and post concussion syndrome in September, 2011. She had a repeat closed head injury in September 2014 but has recovered well from that. Danielys is taking and tolerating Depakote ER  for migraine prevention, and has had  improvement in her headaches. [name] would like to stop taking oral birth control pills and Zoloft. I talked with her about the medications, and told her that she needs to consult with her gynecologist about the birth control pill. She denies being sexually active, and says that she was taking the pill for cycle regulation. I reminded [name] that Depakote products should not be taken by pregnant women and that if she becomes sexually active that she needs to use a reliable form of birth  control, especially if she stops taking the oral contraceptive pill. As for whether or not her headaches will increase in frequency and severity if she stops taking the pill, I told her that it was impossible to know that until she stopped taking the pills.   We also talked about her desire to stop Zoloft and I told her that she needs to consult with her PCP about that, as they started the medication. I told her not stop the medication on her own, as it needs to be tapered away. I also told her that I could not predict if she will have increased headaches if she stops Zoloft.   I cautioned Leshawn about making too many changes in her treatment plan as she prepares to return to college. I told her that if she decided to stop the birth control pill and the Zoloft, that she should only stop one at a time with at least a few weeks to a month in between. I am concerned that her school scheduled will be more stressful than she is aware at this time, and if the medications are providing benefit for her headaches (along with Depakote ER) that she will have sudden increase in headache frequency or severity if she stops those medications just as school resumes.   Finally, I talked with [facility] about the fatigue she is experiencing. It is not clear to me why she is fatigued. I recommended that for now, she begin exercising daily, by walking 5 minutes per day and gradually increasing that to 30 minutes per day, to increase her stamina and endurance. If she continues to be fatigued, we will need to investigate that further.   Cassadie and her mother agreed with these plans. I will see her back in follow up in December when she is on winter break or sooner if needed.   The medication list was reviewed and reconciled.  No changes were made in the prescribed medications today.  A complete medication list was provided to the patient.   Total time spent with the patient was 30 minutes, of which 50% or more was spent in  counseling and coordination of care.

## 2014-12-23 NOTE — Patient Instructions (Signed)
Continue taking your medications as you have been. Talk with your PCP about the Zoloft and with your gynecologist about the birth control pill. I would not stop both at the same time, in case they are giving you more benefit for your headaches than we know.   Begin walking every day, starting with 5 minutes per day and working up to 30 minutes. You need to walk at a brisk pace but do not run. This plan is to increase your stamina and endurance. Let me know if you continue to be fatigued after getting up to 30 minutes per day of walking.   Remember that you need to avoid skipping meals and be very well hydrated, especially when you are exposed to summer heat.   Please plan to return for follow up in December when you are at home on winter break.

## 2015-05-25 ENCOUNTER — Encounter: Payer: Self-pay | Admitting: Family

## 2015-05-25 ENCOUNTER — Ambulatory Visit (INDEPENDENT_AMBULATORY_CARE_PROVIDER_SITE_OTHER): Payer: BLUE CROSS/BLUE SHIELD | Admitting: Family

## 2015-05-25 VITALS — BP 100/70 | HR 76 | Ht 64.5 in | Wt 148.0 lb

## 2015-05-25 DIAGNOSIS — G44219 Episodic tension-type headache, not intractable: Secondary | ICD-10-CM

## 2015-05-25 DIAGNOSIS — G43009 Migraine without aura, not intractable, without status migrainosus: Secondary | ICD-10-CM | POA: Diagnosis not present

## 2015-05-25 DIAGNOSIS — F0781 Postconcussional syndrome: Secondary | ICD-10-CM | POA: Diagnosis not present

## 2015-05-25 MED ORDER — ONDANSETRON HCL 4 MG PO TABS: 4.0000 mg | ORAL_TABLET | Freq: Three times a day (TID) | ORAL | Status: DC | PRN

## 2015-05-25 MED ORDER — SUMATRIPTAN SUCCINATE 50 MG PO TABS: ORAL_TABLET | ORAL | Status: DC

## 2015-05-25 NOTE — Progress Notes (Signed)
Patient: Whitney Morgan MRN: [id number] Sex: female DOB: 01/28/1996  Provider: Elveria Rising, NP Location of Care: [facility]  Note type: Routine return visit  History of Present Illness: Referral Source: Dr. Gweneth Dimitri History from: patient and [facility] chart Chief Complaint: Migraines  [name] is a 19 y.o. young woman with history of tension and migraine headaches. [name] also has history of concussion and post concussion syndrome in September, 2011, with migraines developing after the closed head injury. She had a second head injury in September 2014 when she collided with another player during soccer. She had headaches and vomiting afterwards, then ongoing headaches, neck and shoulder soreness for several weeks afterwards. [name] is taking and tolerating Depakote ER for migraine prevention. She was last seen December 23, 2014.   Today Maleya reports that she has had only a few migraines since her last visit and that they tend to be with her menstrual period. She says that her periods have been prolonged, even while being on an oral contraceptive and that she has an appointment with her gynecologist next week to consider changing to a pill that will suppress her period for 3 months.   [name] also complains of ongoing fatigue. 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 [name] believes that she is sleepier since being on Depakote and wants to stop the medication as she is hopeful that suppressing her menstrual cycle will also help with migraines. She continues to take Sertraline for anxiety and depression.  [name] has been otherwise healthy since last seen. She has other health concerns today other than previously mentioned.  Review of Systems: Please see the HPI for neurologic and other pertinent review of systems. Otherwise, the following systems are noncontributory including constitutional, eyes, ears, nose and throat, cardiovascular, respiratory,  gastrointestinal, genitourinary, musculoskeletal, skin, endocrine, hematologic/lymph, allergic/immunologic and psychiatric.   Past Medical History  Diagnosis Date  . Migraine without aura, without mention of intractable migraine without mention of status migrainosus   . Episodic tension type headache   . Post concussion syndrome   . Concussion with no loss of consciousness     September 2011 and September 2014  . Anxiety 2014   Hospitalizations: No., Head Injury: No., Nervous System Infections: No., Immunizations up to date: Yes.   Past Medical History Comments: See history  Surgical History Past Surgical History  Procedure Laterality Date  . Tonsillectomy  2006  . Tympanostomy tube placement      age 53 months    Family History family history includes Crohn's disease in her father. Family History is otherwise negative for migraines, seizures, cognitive impairment, blindness, deafness, birth defects, chromosomal disorder, autism.  Social History Social History   Social History  . Marital Status: Single    Spouse Name: N/A  . Number of Children: N/A  . Years of Education: N/A   Social History Main Topics  . Smoking status: Never Smoker   . Smokeless tobacco: Never Used  . Alcohol Use: Yes     Comment: Occassionally/Socially  . Drug Use: No  . Sexual Activity: No   Other Topics Concern  . 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 She is majoring in [employer], and is doing well.   Living in an apartment with a roommate off campus. She has a younger brother that lives with her parents.     Allergies Allergies  Allergen Reactions  . Abreva [Docosanol]   .  Neosporin [Neomycin-Bacitracin Zn-Polymyx]     Physical Exam BP 100/70 mmHg  Pulse 76  Ht 5' 4.5" (1.638 m)  Wt 148 lb (67.132 kg)  BMI 25.02 kg/m2  LMP 05/14/2015 (Within Days) General: well developed, well nourished young woman, seated on exam table,  in no evident distress Head: head normocephalic and atraumatic. Oropharynx benign. Neck: supple with no carotid or supraclavicular bruits Cardiovascular: regular rate and rhythm, no murmurs Skin: No rashes or lesions.   Neurologic Exam Mental Status: Awake and fully alert. Oriented to place and time. Recent and remote memory intact. Attention span, concentration, and fund of knowledge appropriate. Mood and affect appropriate. Cranial Nerves: Fundoscopic exam reveals sharp disc margins. Pupils equal, briskly reactive to light. Extraocular movements full without nystagmus. Visual fields full to confrontation. Hearing intact and symmetric to finger rub. Facial sensation intact. Face tongue, palate move normally and symmetrically. Neck flexion and extension normal. Motor: Normal bulk and tone. Normal strength in all tested extremity muscles. Sensory: Intact to touch and temperature in all extremities.  Coordination: Rapid alternating movements normal in all extremities. Finger-to-nose and heel-to shin performed accurately bilaterally. Romberg negative. Gait and Station: Arises from chair without difficulty. Stance is normal. Gait demonstrates normal stride length and balance. Able to heel, toe and tandem walk without difficulty. Reflexes: Diminished and symmetric. Toes downgoing  Impression 1. Migraine without aura, not intractable 2. Episodic tension headaches 3. History of anxiety 4. Fatigue  Recommendations for plan of care [facility] [name] has neither had nor required imaging or lab studies since the last visit. She is a 19 year old young woman with history of headaches as well as concussion and post concussion syndrome in September, 2011. She had a repeat closed head injury in September 2014 but recovered well from that. [name] is taking and tolerating Depakote ER 500mg  for migraine prevention, and has had improvement in her  headaches. She wants to stop the Depakote because of ongoing fatigue and daytime sleepiness. I talked with her about that and gave her instructions to reduce the dose to 1 tablet at night for 1 week, then stop it. I asked her to let me know if she had increase in migraine headaches. If she continues to be unusually fatigued, I asked her to let me know. I will see her back in follow up after she graduates from college in May or June.   The medication list was reviewed and reconciled.  I reviewed changes that were made in the prescribed medications today.  A complete medication list was provided to the patient.  Total time spent with the patient was 25 minutes, of which 50% or more was spent in counseling and coordination of care.

## 2015-05-26 NOTE — Patient Instructions (Signed)
To stop taking Depakote, reduce the dose to 1 tablet at bedtime for 1 week, then stop the medication. Let me know if your migraines worsen.   Please plan to return for follow up in May or June after you graduate from college, or sooner if needed.

## 2015-08-02 ENCOUNTER — Other Ambulatory Visit: Payer: Self-pay | Admitting: Family

## 2015-11-07 ENCOUNTER — Ambulatory Visit (INDEPENDENT_AMBULATORY_CARE_PROVIDER_SITE_OTHER): Payer: BLUE CROSS/BLUE SHIELD | Admitting: Family

## 2015-11-07 ENCOUNTER — Encounter: Payer: Self-pay | Admitting: Family

## 2015-11-07 VITALS — BP 104/70 | HR 82 | Ht 64.5 in | Wt 154.0 lb

## 2015-11-07 DIAGNOSIS — G43009 Migraine without aura, not intractable, without status migrainosus: Secondary | ICD-10-CM

## 2015-11-07 DIAGNOSIS — G44219 Episodic tension-type headache, not intractable: Secondary | ICD-10-CM

## 2015-11-07 MED ORDER — SUMATRIPTAN SUCCINATE 100 MG PO TABS: 100.0000 mg | ORAL_TABLET | Freq: Once | ORAL | Status: DC | PRN

## 2015-11-07 NOTE — Progress Notes (Signed)
Patient: Whitney Morgan MRN: [id number] Sex: female DOB: [birth year]  Provider: Elveria Risingina Devetta Hagenow, NP Location of Care: [facility]  Note type: Routine return visit  History of Present Illness: Referral Source: Dr. Gweneth DimitriWendy Morgan History from: patient, [facility] Chief Complaint: Migraines  Whitney Morgan is a 20 y.o. young woman with history of tension and migraine headaches. [facility] also has history of concussion and post concussion syndrome in September, 2011, with migraines developing after the closed head injury. She had a second head injury in September 2014 when she collided with another player during soccer. She had headaches and vomiting afterwards, then ongoing headaches, neck and shoulder soreness for several weeks afterwards. Whitney [facility] was taking and tolerating Depakote ER for migraine prevention but tapered off it in January 2017 because of improvement in her migraines. She was last seen May 25, 2015.   Today [facility] reports that she has had only a few migraines since her last visit and that they tend to be with her menstrual period. She says that Sumatriptan 50mg  helps but that she generally has to repeat the dose to get relief. [facility] has intermittent tension type headaches that are generally mild and do not require treatment.  [facility] has graduated from [employer] and reports feeling less stressed and fatigued. 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 She hopes to get a job in [place] after that.   [facility] has been otherwise healthy since last seen. She has no other health concerns today other than previously mentioned.  Review of Systems: Please see the HPI for neurologic and other pertinent review of systems. Otherwise, the following systems are noncontributory including constitutional, eyes, ears, nose and throat, cardiovascular, respiratory, gastrointestinal,  genitourinary, musculoskeletal, skin, endocrine, hematologic/lymph, allergic/immunologic and psychiatric.   Past Medical History  Diagnosis Date  . Migraine without aura, without mention of intractable migraine without mention of status migrainosus   . Episodic tension type headache   . Post concussion syndrome   . Concussion with no loss of consciousness     September 2011 and September 2014  . Anxiety 2014   Hospitalizations: No., Head Injury: No., Nervous System Infections: No., Immunizations up to date: Yes.   Past Medical History Comments: See history  Surgical History Past Surgical History  Procedure Laterality Date  . Tonsillectomy  2006  . Tympanostomy tube placement      age 919 months    Family History family history includes Crohn's disease in her father. Family History is otherwise negative for migraines, seizures, cognitive impairment, blindness, deafness, birth defects, chromosomal disorder, autism.  Social History Social History   Social History  . Marital Status: Single    Spouse Name: N/A  . Number of Children: N/A  . Years of Education: N/A   Social History Main Topics  . Smoking status: Never Smoker   . Smokeless tobacco: Never Used  . Alcohol Use: Yes     Comment: Occassionally/Socially  . Drug Use: No  . Sexual Activity: No   Other Topics Concern  . 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   She will be working at [employer]. She lives alone.  She has a younger brother that lives with her parents.     Allergies Allergies  Allergen Reactions  . Abreva [Docosanol]   . Neosporin [Neomycin-Bacitracin Zn-Polymyx]   .  Benzalkonium Chloride Rash and Swelling    Physical Exam BP 104/70 mmHg  Pulse 82  Ht 5' 4.5" (1.638 m)  Wt 154 lb (69.854 kg)  BMI 26.04 kg/m2  LMP 10/27/2015 (Exact Date) General: well developed, well nourished young woman,  seated on exam table, in no evident distress Head: head normocephalic and atraumatic. Oropharynx benign. Neck: supple with no carotid or supraclavicular bruits Cardiovascular: regular rate and rhythm, no murmurs Skin: No rashes or lesions.   Neurologic Exam Mental Status: Awake and fully alert. Oriented to place and time. Recent and remote memory intact. Attention span, concentration, and fund of knowledge appropriate. Mood and affect appropriate. Cranial Nerves: Fundoscopic exam reveals sharp disc margins. Pupils equal, briskly reactive to light. Extraocular movements full without nystagmus. Visual fields full to confrontation. Hearing intact and symmetric to finger rub. Facial sensation intact. Face tongue, palate move normally and symmetrically. Neck flexion and extension normal. Motor: Normal bulk and tone. Normal strength in all tested extremity muscles. Sensory: Intact to touch and temperature in all extremities.  Coordination: Rapid alternating movements normal in all extremities. Finger-to-nose and heel-to shin performed accurately bilaterally. Romberg negative. Gait and Station: Arises from chair without difficulty. Stance is normal. Gait demonstrates normal stride length and balance. Able to heel, toe and tandem walk without difficulty. Reflexes: Diminished and symmetric. Toes downgoing  Impression 1. Migraine without aura, not intractable 2. Episodic tension headaches 3. History of anxiety 4. Fatigue, improved  Recommendations for plan of care The patient's previous [facility] records were reviewed. [facility] has neither had nor required imaging or lab studies since the last visit. She is a 20 year old young woman with history of headaches as well as concussion and post concussion syndrome in September, 2011. She had a repeat closed head injury in September 2014 but recovered well from that. [facility] was taking and tolerating Depakote ER 500mg  but tapered off it in January  2017 because of improvement in migraines. She takes Sumatriptan 50mg  when she has a migraine, but generally has to repeat the dose. I talked with her about increasing the strength to 100mg , to see if she would get better relief with the initial dose. I also talked with her about transitioning to adult [facility] care. 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  Finally, [facility] has gained some weight since her last visit, and I talked with her about being mindful of her calorie intake and doing some exercise daily. I asked her to return for follow up in about 6 months if she is in [place], or to contact me with her location and I will arrange transition of care for her in her new city. [facility] agreed with the plans made today.   Wt Readings from Last 3 Encounters:  11/07/15 154 lb (69.854 kg)  05/25/15 148 lb (67.132 kg)  12/23/14 145 lb (65.772 kg)    The medication list was reviewed and reconciled.  I reviewed changes that were made in the prescribed medications today.  A complete medication list was provided to the patient.     Medication List       This list is accurate as of: 11/07/15 11:59 PM.  Always use your most recent med list.               EXCEDRIN PO  Take 400 mg by mouth. Takes as needed  ondansetron 4 MG tablet  Commonly known as:  ZOFRAN  Take 1 tablet (4 mg total) by mouth every 8 (eight) hours as needed for nausea or vomiting.     SUMAtriptan 100 MG tablet  Commonly known as:  IMITREX  Take 1 tablet (100 mg total) by mouth once as needed for migraine. May repeat in 2 hours if headache persists or recurs.        Dr. Sharene Skeans was consulted regarding the patient.   Total time spent with the patient was 25 minutes, of which 50% or more was spent in counseling and coordination of care.   [facility] Rising

## 2015-11-07 NOTE — Patient Instructions (Addendum)
For your migraines, I have changed the Sumatritpan 50mg  to Sumatriptan 100mg . Be sure to take it at the onset of the migraine, and take Ibuprofen 400mg , Aleve 220mg  or Excedrin Migraine along with the Sumatriptan.   Let me know how this increased strength of Sumatriptan works for you. I also increased the quantity of pills that you will get - your pharmacy should let me know if your insurance will not cover that amount.   Remember that skipping meals, not drinking enough water, not getting enough sleep and stress can trigger headaches. Take good care of yourself as you start your new job.   With international travel, be sure to try to get sufficient sleep and drink plenty of water while traveling.   [place], I will be happy to help you transition to the care of an adult neurology provider in that area. Just let me know when you get settled.   I am concerned that you have gained weight since I saw you last. Try to work on keeping your weight steady and not gain further. I have printed your last 3 weights for you to see.   Wt Readings from Last 3 Encounters:  11/07/15 154 lb (69.854 kg)  05/25/15 148 lb (67.132 kg)  12/23/14 145 lb (65.772 kg)   Consider signing up for MyChart - your online portal to your electronic medical records. You can send me a secure message using MyChart, which may be helpful as you are traveling.   If you have not relocated by the end of the year, please plan on returning for follow up in about 6 months.

## 2016-04-03 ENCOUNTER — Telehealth (INDEPENDENT_AMBULATORY_CARE_PROVIDER_SITE_OTHER): Payer: Self-pay

## 2016-04-03 NOTE — Telephone Encounter (Signed)
I reviewed your note and agree with your advice and recommendations. 

## 2016-04-03 NOTE — Telephone Encounter (Signed)
[facility] stating that she has been vomiting since last night. She has a headache in the back of her head. Requesting a CB. [phone]

## 2016-04-03 NOTE — Telephone Encounter (Signed)
I called [facility] and talked with her. She said that she developed a migraine yesterday evening and has been vomiting since then. She is concerned because the pain is in the back of her head and that is not typical for her. She said that she took Imitrex but that it did not help her headache. I told [name] that some people have pain in the back of the head and that does not always mean something new is wrong. I told her that since she has been vomiting for almost 24 hours now that she will likely need to go to ER for treatment. She agreed with this plan. TG

## 2016-12-09 ENCOUNTER — Encounter (INDEPENDENT_AMBULATORY_CARE_PROVIDER_SITE_OTHER): Payer: Self-pay | Admitting: Family

## 2016-12-09 ENCOUNTER — Ambulatory Visit (INDEPENDENT_AMBULATORY_CARE_PROVIDER_SITE_OTHER): Payer: 59 | Admitting: Family

## 2016-12-09 VITALS — BP 100/70 | HR 78 | Ht 64.37 in | Wt 141.8 lb

## 2016-12-09 DIAGNOSIS — G43009 Migraine without aura, not intractable, without status migrainosus: Secondary | ICD-10-CM

## 2016-12-09 DIAGNOSIS — G44219 Episodic tension-type headache, not intractable: Secondary | ICD-10-CM | POA: Diagnosis not present

## 2016-12-09 MED ORDER — SUMATRIPTAN SUCCINATE 100 MG PO TABS: 100.0000 mg | ORAL_TABLET | Freq: Once | ORAL | 5 refills | Status: DC | PRN

## 2016-12-09 NOTE — Patient Instructions (Signed)
Let me know if your headaches become more frequent or more severe.   Please plan to return for follow up in 1 year or sooner if needed. I will help you to transition to an adult neurology provider at that time.

## 2016-12-09 NOTE — Progress Notes (Signed)
Patient: [facility] MRN: [id number] Sex: female DOB: [birth year]  Provider: Elveria Rising, NP Location of Care: [facility]  Note type: Routine return visit  History of Present Illness: Referral Source: Whitney Dimitri, MD History from: patient and [facility] Chief Complaint: Follow up on Migraines  [facility] is a 21 y.o. young woman with history of tension and migraine headaches. [facility] also has history of concussion and post concussion syndrome in September, 2011, with migraines developing after the closed head injury. She had a second head injury in September 2014 when she collided with another player during soccer. She had headaches and vomiting afterwards, then ongoing headaches, neck and shoulder soreness for several weeks afterwards. [name] was taking and tolerating Depakote ER for migraine prevention but tapered off it in January 2017 because of improvement in her migraines. She was last seen Nov 07, 2015.  Today [facility] reports that she has had only a few migraines since her last visit and that they she has been using essential oils as treatment when migraines occurs. She says that the essential oils give her relief and that she does not need to take Sumatriptan most of the time. [facility] has intermittent tension type headaches that are generally mild and do not require treatment.  [facility] is working at [employer] in [place], [place]. 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 [facility] has been otherwise healthy since last seen. She has no other health concerns today other than previously mentioned.  Review of Systems: Please see the HPI for neurologic and other pertinent review of systems. Otherwise, the following systems are noncontributory including  constitutional, eyes, ears, nose and throat, cardiovascular, respiratory, gastrointestinal, genitourinary, musculoskeletal, skin, endocrine, hematologic/lymph, allergic/immunologic and psychiatric.   Past Medical History:  Diagnosis Date  . Anxiety 2014  . Concussion with no loss of consciousness    September 2011 and September 2014  . Episodic tension type headache   . Migraine without aura, without mention of intractable migraine without mention of status migrainosus   . Post concussion syndrome    Hospitalizations: No., Head Injury: No., Nervous System Infections: No., Immunizations up to date: Yes.   Past Medical History Comments: See history  Surgical History Past Surgical History:  Procedure Laterality Date  . TONSILLECTOMY  2006  . TYMPANOSTOMY TUBE PLACEMENT     age 29 months    Family History family history includes Anxiety disorder in her father; Crohn's disease in her father; Migraines in her brother. Family History is otherwise negative for migraines, seizures, cognitive impairment, blindness, deafness, birth defects, chromosomal disorder, autism.  Social History Social History   Social History  . Marital status: Single    Spouse name: N/A  . Number of children: N/A  . Years of education: N/A   Social History Main Topics  . Smoking status: Never Smoker  . Smokeless tobacco: Never Used  . Alcohol use Yes     Comment: Occassionally/Socially  . Drug use: No  . Sexual activity: No   Other Topics Concern  . 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   She is working at [facility]. She lives alone.  She has a younger brother that lives with her parents.  Allergies Allergies  Allergen Reactions  . Abreva [Docosanol]   . Neosporin [Neomycin-Bacitracin Zn-Polymyx]   . Sulfa Antibiotics     Rash, fever, and muscle pain  . Benzalkonium Chloride Rash and Swelling      Physical Exam BP 100/70   Pulse 78   Ht 5' 4.37" (1.635 m)   Wt 141 lb 12.8 oz (64.3 kg)   BMI 24.06 kg/m  General: well developed, well nourished young woman, seated on exam table, in no evident distress Head: head normocephalic and atraumatic. Oropharynx benign. Neck: supple with no carotid or supraclavicular bruits Cardiovascular: regular rate and rhythm, no murmurs Skin: No rashes or lesions.   Neurologic Exam Mental Status: Awake and fully alert. Oriented to place and time. Recent and remote memory intact. Attention span, concentration, and fund of knowledge appropriate. Mood and affect appropriate. Cranial Nerves: Fundoscopic exam reveals sharp disc margins. Pupils equal, briskly reactive to light. Extraocular movements full without nystagmus. Visual fields full to confrontation. Hearing intact and symmetric to finger rub. Facial sensation intact. Face tongue, palate move normally and symmetrically. Neck flexion and extension normal. Motor: Normal bulk and tone. Normal strength in all tested extremity muscles. Sensory: Intact to touch and temperature in all extremities.  Coordination: Rapid alternating movements normal in all extremities. Finger-to-nose and heel-to shin performed accurately bilaterally. Romberg negative. Gait and Station: Arises from chair without difficulty. Stance is normal. Gait demonstrates normal stride length and balance. Able to heel, toe and tandem walk without difficulty. Reflexes: Diminished and symmetric. Toes downgoing  Impression 1. Migraine without aura, not intractable 2. Episodic tension headaches 3. History of anxiety 4. Fatigue, improved  Recommendations for plan of care [facility] has neither had nor required imaging or lab studies since the last visit. She is a 21 year old young woman with history of headaches as well as concussion and post concussion syndrome in September,  2011. She had a repeat closed head injury in September 2014 but recovered well from that. [facility] was taking and tolerating Depakote ER 500mg  but tapered off it in January 2017 because of improvement in migraines. She has been using essential oils to treat her migraines and has had good results with that. She takes Sumatriptan when the migraine is particularly severe. I also talked with her about transitioning to adult neurology care. [place] next year so I will continue to see her for another year, then help her transition.  [facility] agreed with the plans made today.   The medication list was reviewed and reconciled.  No changes were made in the prescribed medications today.  A complete medication list was provided to the patient.  Allergies as of 12/09/2016      Reactions   Abreva [docosanol]    Neosporin [neomycin-bacitracin Zn-polymyx]    Sulfa Antibiotics    Rash, fever, and muscle pain   Benzalkonium Chloride Rash, Swelling      Medication List       Accurate as of 12/09/16  7:59 PM. Always use your most recent med list.          EXCEDRIN PO Take 400 mg by mouth. Takes as needed   ondansetron 4 MG tablet Commonly known as:  ZOFRAN Take 1 tablet (4 mg total) by mouth every 8 (eight) hours as needed for nausea or vomiting.   penciclovir 1 % cream Commonly known as:  DENAVIR Apply topically.   SUMAtriptan  100 MG tablet Commonly known as:  IMITREX Take 1 tablet (100 mg total) by mouth once as needed for migraine. May repeat in 2 hours if headache persists or recurs.       Dr. Sharene Skeans was consulted regarding the patient.   Total time spent with the patient was 25 minutes, of which 50% or more was spent in counseling and coordination of care.   [facility] Rising NP-C

## 2017-10-31 ENCOUNTER — Telehealth (INDEPENDENT_AMBULATORY_CARE_PROVIDER_SITE_OTHER): Payer: Self-pay | Admitting: Family

## 2017-10-31 DIAGNOSIS — G43809 Other migraine, not intractable, without status migrainosus: Secondary | ICD-10-CM

## 2017-10-31 MED ORDER — TIZANIDINE HCL 2 MG PO TABS: ORAL_TABLET | ORAL | 2 refills | Status: DC

## 2017-10-31 NOTE — Telephone Encounter (Signed)
I called and [facility] with [facility]. She said that her "regular" migraines were not problematic but in the last couple of months she has been experiencing sharp stabbing pains in her head that sometimes made her head feel sore. She said that they occurred intermittently but didn't last long. When they occurred, her head felt sore and she had trouble sleeping afterwards. I recommended a trial of Tizanidine at bedtime, and she agreed. I reminded [facility] that she should come in for a visit this summer. TG

## 2017-10-31 NOTE — Telephone Encounter (Signed)
°  Who's calling (name and relationship to patient) : [facility] (Self)  Best contact number: [phone] (M  Provider they see: [name]   Reason for call: stated that she has some concerns she would like to discuss with [name], she is unsure if a physical appointment is needed. She stated in the past [name] has conducted "facetime visits" with her.

## 2018-01-19 ENCOUNTER — Other Ambulatory Visit (INDEPENDENT_AMBULATORY_CARE_PROVIDER_SITE_OTHER): Payer: Self-pay | Admitting: Family

## 2018-01-19 DIAGNOSIS — G43009 Migraine without aura, not intractable, without status migrainosus: Secondary | ICD-10-CM

## 2018-08-04 ENCOUNTER — Telehealth (INDEPENDENT_AMBULATORY_CARE_PROVIDER_SITE_OTHER): Payer: Self-pay | Admitting: Family

## 2018-08-04 NOTE — Telephone Encounter (Signed)
I called and left a message for [facility]. I am unaware of interactions between Zanaflex and Nexplanon. She has an appointment on Thursday and I will be happy to discuss it further at that time. TG

## 2018-08-04 NOTE — Telephone Encounter (Signed)
Who's calling (name and relationship to patient) : [facility] Morgan (self)  Best contact number: [phone]  Provider they see: Dr. Blane Ohara  Reason for call: Recently got the Nexplanon implant birthcontrol  but before that was having cluster headaches in the back of her head, Dr. Blane Ohara was giving her muscle relaxer Zanaflex, but [name] said that she was researching and said that she read that Zanaflex and birth control wasn't suppose to be taken together. Stated she wanted to just make sure with Dr. Blane Ohara, said she has not been taking the Zanaflex since last fall. She had the implant put in 2 weeks ago. Said that the Zanaflex did help.   Call ID:      PRESCRIPTION REFILL ONLY  Name of prescription:  Pharmacy:

## 2018-08-05 ENCOUNTER — Ambulatory Visit (INDEPENDENT_AMBULATORY_CARE_PROVIDER_SITE_OTHER): Payer: 59 | Admitting: Family

## 2018-08-05 ENCOUNTER — Encounter (INDEPENDENT_AMBULATORY_CARE_PROVIDER_SITE_OTHER): Payer: Self-pay | Admitting: Family

## 2018-08-05 VITALS — BP 106/64 | HR 84 | Ht 64.0 in | Wt 139.2 lb

## 2018-08-05 DIAGNOSIS — M79604 Pain in right leg: Secondary | ICD-10-CM

## 2018-08-05 DIAGNOSIS — G44219 Episodic tension-type headache, not intractable: Secondary | ICD-10-CM | POA: Diagnosis not present

## 2018-08-05 DIAGNOSIS — G43009 Migraine without aura, not intractable, without status migrainosus: Secondary | ICD-10-CM | POA: Diagnosis not present

## 2018-08-05 DIAGNOSIS — M79605 Pain in left leg: Secondary | ICD-10-CM | POA: Diagnosis not present

## 2018-08-05 NOTE — Progress Notes (Signed)
Patient: Whitney Morgan MRN: [id number] Sex: female DOB: 1996-04-20  Provider: Elveria Rising, NP Location of Care: [facility]  Note type: Routine return visit  History of Present Illness: Referral Source: Gweneth Dimitri, MD History from: patient and [facility] chart Chief Complaint: Migraines  Whitney Morgan is a 23 y.o. young woman with history of tension and migraine headaches. She was last seen December 09, 2016. Whitney Morgan tells me today that she been experiencing headaches and posterior neck pain, as well as leg soreness. She finds that Tizanidine gives her relief of headaches but is concerned about possible interactions between the medications and her recent Nexplanon hormone implant for birth control. 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 She spends time working at a computer as well as traveling to [place] and [place]. Whitney Morgan has worked to drink more water and get sufficient sleep. She denies skipping meals. She doesn't exercise every day but says that she walks a great deal in her work.   With the migraine headaches, she has holocephalic or posterior pounding pain, intolerance to light and nausea without vomiting. Fortunately these are infrequent. She also has lesser headaches that typically involve posterior pain that evolves from neck pain. She complains of posterior and right side neck pain that is intermittent. She has had no known trauma or injury. Whitney Morgan also describes diffuse leg soreness. She has had no increase in exercise, positioning at work or other etiology for the pain.   Whitney Morgan has been otherwise generally healthy since she was last seen. She has no other health concerns today other than previously mentioned.  Review of Systems: Please see the HPI for neurologic and other pertinent review of systems. Otherwise, all other systems were reviewed and were negative.    Past Medical  History:  Diagnosis Date  . Anxiety 2014  . Concussion with no loss of consciousness    September 2011 and September 2014  . Episodic tension type headache   . Migraine without aura, without mention of intractable migraine without mention of status migrainosus   . Post concussion syndrome    Hospitalizations: No., Head Injury: No., Nervous System Infections: No., Immunizations up to date: Yes.   Past Medical History Comments: See HPI Copied from previous record: Whitney Morgan also has history of concussion and post concussion syndrome in September, 2011, with migraines developing after the closed head injury. She had a second head injury in September 2014 when she collided with another player during soccer. She had headaches and vomiting afterwards, then ongoing headaches, neck and shoulder soreness for several weeks afterwards. Whitney Morgan was taking and tolerating Depakote ER for migraine prevention but tapered off it in January 2017 because of improvement in her migraines.   Surgical History Past Surgical History:  Procedure Laterality Date  . TONSILLECTOMY  2006  . TYMPANOSTOMY TUBE PLACEMENT     age 11 months    Family History family history includes Anxiety disorder in her father; Crohn's disease in her father; Migraines in her brother. Family History is otherwise negative for migraines, seizures, cognitive impairment, blindness, deafness, birth defects, chromosomal disorder, autism.  Social History Social History   Socioeconomic History  . Marital status: Single    Spouse name: Not on file  . Number of children: Not on file  . Years of education: Not on file  . Highest education level: Not on file  Occupational History  . Not on file  Social  Needs  . Financial resource strain: Not on file  . Food insecurity:    Worry: Not on file    Inability: Not on file  . Transportation needs:    Medical: Not on file    Non-medical: Not on file  Tobacco Use  . Smoking status: Never Smoker   . Smokeless tobacco: Never Used  Substance and Sexual Activity  . Alcohol use: Yes    Comment: Occassionally/Socially  . Drug use: No  . Sexual activity: Never    Birth control/protection: Pill  Lifestyle  . Physical activity:    Days per week: Not on file    Minutes per session: Not on file  . Stress: Not on file  Relationships  . Social connections:    Talks on phone: Not on file    Gets together: Not on file    Attends religious service: Not on file    Active member of club or organization: Not on file    Attends meetings of clubs or organizations: Not on file    Relationship status: Not on file  Other Topics Concern  . 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   She is working at [employer]. She lives alone.  She has a younger brother that lives with her parents.     Allergies Allergies  Allergen Reactions  . Abreva [Docosanol]   . Neosporin [Neomycin-Bacitracin Zn-Polymyx]   . Sulfa Antibiotics     Rash, fever, and muscle pain  . Sulfasalazine     Rash, fever, and muscle pain  . Benzalkonium Chloride Rash and Swelling    Physical Exam BP 106/64   Pulse 84   Ht 5\' 4"  (1.626 m)   Wt 139 lb 3.2 oz (63.1 kg)   BMI 23.89 kg/m  General: Well developed, well nourished young woman, seated on exam table, in no evident distress, sandy blonde hair, brown eyes, right handed Head: Head normocephalic and atraumatic.  Oropharynx benign. Neck: Supple with no carotid bruits Cardiovascular: Regular rate and rhythm, no murmurs Respiratory: Breath sounds clear to auscultation Musculoskeletal: No obvious deformities or scoliosis Skin: No rashes or neurocutaneous lesions  Neurologic Exam Mental Status: Awake and fully alert.  Oriented to place and time.  Recent and remote memory intact.  Attention span, concentration, and fund of knowledge appropriate.  Mood and affect  appropriate. Cranial Nerves: Fundoscopic exam reveals sharp disc margins.  Pupils equal, briskly reactive to light.  Extraocular movements full without nystagmus.  Visual fields full to confrontation.  Hearing intact and symmetric to finger rub.  Facial sensation intact.  Face tongue, palate move normally and symmetrically.  Neck flexion and extension normal. Motor: Normal bulk and tone. Normal strength in all tested extremity muscles. Sensory: Intact to touch and temperature in all extremities.  Coordination: Rapid alternating movements normal in all extremities.  Finger-to-nose and heel-to shin performed accurately bilaterally.  Romberg negative. Gait and Station: Arises from chair without difficulty.  Stance is normal. Gait demonstrates normal stride length and balance.   Able to heel, toe and tandem walk without difficulty. Reflexes: 1+ and symmetric. Toes downgoing.  Impression 1.  Migraine without aura 2.  Episodic tension headache 3.  Leg soreness, bilateral   Recommendations for plan of care The patient's previous [facility] records were reviewed. Hilde has neither had nor required imaging or lab studies since the last visit. She is a 23 year old young  woman with history of migraine and tension headaches, and more recently leg soreness. She has found that Tizanidine works well to give relief of headache and neck pain but is concerned about potential interactions with Nexplanon that would make the hormone less effective as birth control. I told Gredmarie that I am unaware of any interaction with this medication and Nexplanon. We talked about the headaches and leg soreness, and I recommended that she look at factors such as positioning of her desk and computer monitor. I recommended starting daily exercise, which is known to reduce headache frequency as well a promote general well being. I will see Aryonna back in follow up in 1 year or as needed in the future. She agreed with the plans made today.    The medication list was reviewed and reconciled.  No changes were made in the prescribed medications today.  A complete medication list was provided to the patient.  Allergies as of 08/05/2018      Reactions   Abreva [docosanol]    Neosporin [neomycin-bacitracin Zn-polymyx]    Sulfa Antibiotics    Rash, fever, and muscle pain   Sulfasalazine    Rash, fever, and muscle pain   Benzalkonium Chloride Rash, Swelling      Medication List       Accurate as of August 05, 2018  9:49 AM. Always use your most recent med list.        EXCEDRIN PO Take 400 mg by mouth. Takes as needed   ondansetron 4 MG tablet Commonly known as:  ZOFRAN Take 1 tablet (4 mg total) by mouth every 8 (eight) hours as needed for nausea or vomiting.   SUMAtriptan 100 MG tablet Commonly known as:  IMITREX TAKE 1 TABLET ON ONSET OF MIGRAINE HEADACHE, MAY REPEAT X1 IN 2 HOURSIF NEEDED.   tiZANidine 2 MG tablet Commonly known as:  ZANAFLEX Take 1 tablet at bedtime for head pain       Dr. Sharene Skeans was consulted regarding the patient.   Total time spent with the patient was 25 minutes, of which 50% or more was spent in counseling and coordination of care.   Elveria Rising NP-C

## 2018-08-07 DIAGNOSIS — M79604 Pain in right leg: Secondary | ICD-10-CM | POA: Insufficient documentation

## 2018-08-07 DIAGNOSIS — M79605 Pain in left leg: Secondary | ICD-10-CM

## 2018-08-07 NOTE — Patient Instructions (Addendum)
Thank you for coming in today.   Instructions for you until your next appointment are as follows: 1. I am unaware of any interaction between Tizanidine and Nexplanon 2. Look at your desk and monitor height for best ergonomic positioning 3. Consider starting an exercise program 4. Please plan on returning for follow up in 1 year or sooner if needed.

## 2018-08-14 ENCOUNTER — Telehealth (INDEPENDENT_AMBULATORY_CARE_PROVIDER_SITE_OTHER): Payer: Self-pay | Admitting: Family

## 2018-08-14 DIAGNOSIS — M542 Cervicalgia: Secondary | ICD-10-CM

## 2018-08-14 DIAGNOSIS — G43009 Migraine without aura, not intractable, without status migrainosus: Secondary | ICD-10-CM

## 2018-08-14 DIAGNOSIS — M549 Dorsalgia, unspecified: Secondary | ICD-10-CM

## 2018-08-14 DIAGNOSIS — G44219 Episodic tension-type headache, not intractable: Secondary | ICD-10-CM

## 2018-08-14 MED ORDER — AMITRIPTYLINE HCL 10 MG PO TABS: 10.0000 mg | ORAL_TABLET | Freq: Every day | ORAL | 3 refills | Status: DC

## 2018-08-14 NOTE — Telephone Encounter (Signed)
I called and talked with [facility]. Because the headaches are daily, I recommmended a migraine preventative to see if we can reduce headache frequency and severity. I also talked with her about the back and neck pain and recommended physical therapy. Whitney Morgan agreed with these plans and I faxed orders for PT to [facility] in [place]  and sent in a prescription for Amitriptyline 10mg  at bedtime. I asked [name] to call me in 1 week to report on how she was doing. TG

## 2018-08-14 NOTE — Telephone Encounter (Signed)
°  Who's calling (name and relationship to patient) : Geologist, engineering (Self) Best contact number: [phone] Provider they see: [name] Reason for call: Pt stated headaches have gotten worse even with muscle relaxers. Pt stated she saw a chiropractor who saw that there was tension and swelling in the back of her neck which is where she has felt the headaches. Pt would like a return call from [place] at her earliest convenience.

## 2018-08-14 NOTE — Telephone Encounter (Signed)
Spoke with [employer] about her phone message. She states that her headaches started in the middle of January. She states that she vomits and has a lot of pain in the middle of her back and upper shoulders. She states that she recently had the Nexplanon implant in her arm but she does not believe it has anything to do with her headaches. She does not think that they are migraines.

## 2018-08-24 ENCOUNTER — Telehealth (INDEPENDENT_AMBULATORY_CARE_PROVIDER_SITE_OTHER): Payer: Self-pay | Admitting: Family

## 2018-08-24 NOTE — Telephone Encounter (Signed)
°  Who's calling (name and relationship to patient) : Self Best contact number: [phone] Provider they see: Elveria Rising Reason for call: Patient has been taking amitriptyline and still having headaches. Please advise.      PRESCRIPTION REFILL ONLY  Name of prescription:  Pharmacy:

## 2018-08-24 NOTE — Telephone Encounter (Signed)
error 

## 2018-08-24 NOTE — Telephone Encounter (Signed)
Spoke with Whitney Morgan about her phone message. She states that she is still having the headaches that start in her neck and move their way up. She states that they are not as frequent but she is still having them. She had one on Wednesday, Thursday, and then Saturday. She stated she vomited on Wednesday but she thinks it was due to taking the Imitrex. She states that her face feels like she has a lot of pressure but she has no other symptoms that has to do with sinuses. She states that her ears also keep popping. Please advise

## 2018-08-26 NOTE — Telephone Encounter (Signed)
I called and talked to [facility]. She said that she has been better this week than last week. She feels that PT has helped. She is tolerating Amitriptyline and does not feel that the dose should change at this time. I asked her to stay in touch and to let me know how she is doing. She agreed with this plan. TG

## 2018-09-17 ENCOUNTER — Other Ambulatory Visit (INDEPENDENT_AMBULATORY_CARE_PROVIDER_SITE_OTHER): Payer: Self-pay | Admitting: Family

## 2018-09-17 DIAGNOSIS — G43009 Migraine without aura, not intractable, without status migrainosus: Secondary | ICD-10-CM

## 2018-09-17 MED ORDER — AMITRIPTYLINE HCL 10 MG PO TABS: 10.0000 mg | ORAL_TABLET | Freq: Every day | ORAL | 2 refills | Status: DC

## 2018-09-17 NOTE — Telephone Encounter (Signed)
°  Who's calling (name and relationship to patient) : Izell [place] Best contact number: [phone] Provider they see: Blane Ohara Reason for call:  Zila has upped her Amitriptyline to 20mg .  Please send in RX with that dosage.      PRESCRIPTION REFILL ONLY  Name of prescription:   Pharmacy: [facility]

## 2018-09-17 NOTE — Telephone Encounter (Signed)
Rx has been changed and sent to the pharmacy

## 2018-09-21 ENCOUNTER — Telehealth (INDEPENDENT_AMBULATORY_CARE_PROVIDER_SITE_OTHER): Payer: Self-pay | Admitting: Family

## 2018-09-21 DIAGNOSIS — G43009 Migraine without aura, not intractable, without status migrainosus: Secondary | ICD-10-CM

## 2018-09-21 DIAGNOSIS — G43809 Other migraine, not intractable, without status migrainosus: Secondary | ICD-10-CM

## 2018-09-21 MED ORDER — AMITRIPTYLINE HCL 10 MG PO TABS: ORAL_TABLET | ORAL | 2 refills | Status: DC

## 2018-09-21 MED ORDER — TIZANIDINE HCL 2 MG PO TABS: ORAL_TABLET | ORAL | 2 refills | Status: DC

## 2018-09-21 NOTE — Telephone Encounter (Signed)
Who's calling (name and relationship to patient) : Cornelia Yeats (self)  Best contact number: [phone]  Provider they see: Elveria Rising  Reason for call:   Had starting on Evlavil, was taking 2 but for the last week having a lot of pain in her neck and back. Wants to know if there is anything that can be done. Please advise   Call ID:      PRESCRIPTION REFILL ONLY  Name of prescription:  Pharmacy:

## 2018-09-21 NOTE — Telephone Encounter (Signed)
I called and talked with [facility]. She continues to have pain in back of her neck and head that is sometimes severe. She went to PT for awhile before Covid 19 restrictions and felt that it helped but is now just doing exercises at home. She is frustrated with ongoing pain and wonders about imaging when the Covid 19 restrictions are lifted. I agreed and asked her to continue to do exercises at home and be mindful of body mechanics. I recommended increasing the Amitriptyline to 30mg  at bedtime and increasing the Tizanidine to 4mg , using 2 mg tablets so that she can be flexible with dosing when needed. I reminded Cidney that the Tizanidine as a side effect of sleepiness. If this is not helpful in a couple of weeks, I may consider a trial of Cymbalta. Rashunda agreed with these plans. TG

## 2018-09-21 NOTE — Addendum Note (Signed)
Addended by: Princella Ion on: 09/21/2018 05:22 PM   Modules accepted: Orders

## 2018-10-26 ENCOUNTER — Telehealth (INDEPENDENT_AMBULATORY_CARE_PROVIDER_SITE_OTHER): Payer: Self-pay | Admitting: Family

## 2018-10-26 NOTE — Telephone Encounter (Signed)
I called and left a message asking Kendra to call back. TG

## 2018-10-26 NOTE — Telephone Encounter (Signed)
Who's calling (name and relationship to patient) : Whitney Morgan (self)  Best contact number: [phone]  Provider they see: Elveria Rising Reason for call:  [name] called in with the update that [name] requested, states she is still having issues with her migraines, [name] feels like the Elavil works for 2-3 weeks and then she goes right back to having the headaches again. Was originally taking Elavil 10mg  but was increased to 20mg . She said that [name] did say she could up it again to 30mg  but does not want to do this, wondering if anything else like arthritis could be causing back pain. Please advise  Call ID:      PRESCRIPTION REFILL ONLY  Name of prescription:  Pharmacy:

## 2018-10-27 NOTE — Telephone Encounter (Signed)
I called and talked to [facility]. She reports ongoing right side and posterior neck and upper back pain that triggers headaches. She said that Amitriptyline helps for a couple of weeks, then pain returns. She said that her mother has diagnosis of arthritis and wonders if that could be her problem as well. She asked about blood tests or imaging of the painful areas.  I explained to [facility] that she needs to be evaluated to determine if testing needs to be done. She accepted an appointment with me on May 22 at 9AM. TG

## 2018-10-30 ENCOUNTER — Ambulatory Visit (INDEPENDENT_AMBULATORY_CARE_PROVIDER_SITE_OTHER): Payer: 59 | Admitting: Family

## 2018-10-30 ENCOUNTER — Other Ambulatory Visit: Payer: Self-pay

## 2018-10-30 ENCOUNTER — Encounter (INDEPENDENT_AMBULATORY_CARE_PROVIDER_SITE_OTHER): Payer: Self-pay | Admitting: Family

## 2018-10-30 VITALS — BP 104/64 | HR 104 | Ht 64.5 in | Wt 145.4 lb

## 2018-10-30 DIAGNOSIS — R200 Anesthesia of skin: Secondary | ICD-10-CM | POA: Diagnosis not present

## 2018-10-30 DIAGNOSIS — M546 Pain in thoracic spine: Secondary | ICD-10-CM

## 2018-10-30 DIAGNOSIS — M542 Cervicalgia: Secondary | ICD-10-CM

## 2018-10-30 DIAGNOSIS — Z8261 Family history of arthritis: Secondary | ICD-10-CM

## 2018-10-30 DIAGNOSIS — G43009 Migraine without aura, not intractable, without status migrainosus: Secondary | ICD-10-CM

## 2018-10-30 MED ORDER — AMITRIPTYLINE HCL 10 MG PO TABS: ORAL_TABLET | ORAL | 2 refills | Status: DC

## 2018-10-30 MED ORDER — CYCLOBENZAPRINE HCL 5 MG PO TABS: ORAL_TABLET | ORAL | 0 refills | Status: DC

## 2018-10-30 NOTE — Patient Instructions (Addendum)
Thank you for coming in today.   Instructions for you until your next appointment are as follows: 1. I have given you lab orders to look for inflammation and arthritis. I will call you when I have the results.  2. We will schedule you for an MRI of the thoracic spine. We will need to get insurance approval first, so you will receive a call from [facility] when that has been approved and is ready to schedule. I will call you when I receive the results. 3. Consider adding CoQ10 supplements - 100mg , 3 times per day 4. Increase the Magnesium to 1 tablet 2 times per day with food. Check the label and make sure that this is between 200 and 500mg . If more than that, do not increase.  5. I have ordered Flexeril 5mg  for you to take at night for a week. Let me know how that works to relieve your pain. If it helps some, we will either continue it for another week or adjust the dose.  6. Continue using alternating ice and heat for your neck.  7. Increase the Amitriptyline 10mg  to 3 tablets at bedtime 8. Please sign up for MyChart if you have not done so 9.  Please plan to return for follow up in 1 month or sooner if needed. This can be a Webex visit or you can come in if you would like to do so.

## 2018-10-30 NOTE — Progress Notes (Signed)
Whitney Morgan   MRN:  [id number]  1995-11-16   Provider: Elveria Rising NP-C Location of Care: [facility]  Visit type: urgent routine visit  Last visit: 08/05/2018  Referral source: Gweneth Dimitri, MD History from: chcn, patient and her mother  Brief history:  History of migraine and tension headaches since childhood. When she was last seen, she was experiencing increase in headache frequency as well as posterior neck and upper back back, and leg soreness. She felt that the headaches were triggered by the neck and back pain. Amitriptyline was prescribed and she had some reduction of headaches for a few weeks, then the pain worsened again. The dose was increased and Whitney Morgan was referred for physical therapy. She was seen by a chiropractor and x-rays were performed. Whitney Morgan brought a disc with her that she had had the x-ray imagines and reports, but I was unable to open the application. She found that physical therapy helped briefly but then pain resumed. She has been doing home exercises recommended by physical therapy since the therapy sessions ended. She is also taking magnesium supplements for migraine prevention.  Today's concerns: Whitney Morgan is being seen today because she continues to have neck and upper back pain that trigger headaches. She reports that she has left side neck pain, located near the base of her skull. She has generalized upper back pain but points to a specific area of pain in the region of T5/T6. 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 She reports that the pain in the mid-back is worse when lying down, and worse when exercising. She also reports that her headaches worsen when exercising. She has trouble sleeping and is having difficulty doing her work because of ongoing pain. Whitney Morgan said that she had a lumbar fracture as a child and  says that the mid-back pain reminds her of the pain she experienced with the lumbar fracture.   Along with the neck and back pain, Whitney Morgan is also experiencing intermittent leg numbness, which she describes as either a feeling of heaviness or a feeling that her legs are not there. She can get some relief by moving around. She has had no arm pain or numbness and no incontinence of bowel or bladder.   Whitney Morgan reports that she has had a headache of some type every day this week. Some are migraine and some are severe tension headaches. She feels that all the headaches begin with worsening neck and/or back pain. She is anxious and frustrated about the ongoing pain that is becoming debilitating. She wonders if she has arthritis in her neck or back, because her mother has arthritis.   Whitney Morgan has been otherwise generally healthy since she was last seen. Neither she nor her mother have other health concerns for her today other than previously mentioned.  Review of systems: Please see HPI for neurologic and other pertinent review of systems. Otherwise all other systems were reviewed and were negative.  Problem List: Patient Active Problem List   Diagnosis Date Noted   Bilateral leg pain 08/07/2018   Migraine without aura 03/28/2013   Episodic tension type headache 03/28/2013     Past Medical History:  Diagnosis Date   Anxiety 2014   Concussion with no loss of consciousness    September 2011 and September 2014   Episodic tension type headache    Migraine without aura, without mention of intractable  migraine without mention of status migrainosus    Post concussion syndrome     Past medical history comments: See HPI Copied from previous record: Whitney FelixKatelyn also has history of concussion and post concussion syndrome in September, 2011, with migraines developing after the closed head injury. She had a second head injury in September 2014 when she collided with another player during soccer. She  had headaches and vomiting afterwards, then ongoing headaches, neck and shoulder soreness for several weeks afterwards. Whitney FelixKatelyn was taking and tolerating Depakote ER for migraine prevention but tapered off it in January 2017 because of improvement in her migraines.  Surgical history: Past Surgical History:  Procedure Laterality Date   TONSILLECTOMY  2006   TYMPANOSTOMY TUBE PLACEMENT     age 879 months     Family history: family history includes Anxiety disorder in her father; Crohn's disease in her father; Migraines in her brother.   Social history: Social History   Socioeconomic History   Marital status: Single    Spouse name: Not on file   Number of children: Not on file   Years of education: Not on file   Highest education level: Not on file  Occupational History   Not on file  Social Needs   Financial resource strain: Not on file   Food insecurity:    Worry: Not on file    Inability: Not on file   Transportation needs:    Medical: Not on file    Non-medical: Not on file  Tobacco Use   Smoking status: Never Smoker   Smokeless tobacco: Never Used  Substance and Sexual Activity   Alcohol use: Yes    Comment: Occassionally/Socially   Drug use: No   Sexual activity: Never    Birth control/protection: Pill  Lifestyle   Physical activity:    Days per week: Not on file    Minutes per session: Not on file   Stress: Not on file  Relationships   Social connections:    Talks on phone: Not on file    Gets together: Not on file    Attends religious service: Not on file    Active member of club or organization: Not on file    Attends meetings of clubs or organizations: Not on file    Relationship status: Not on file   Intimate partner violence:    Fear of current or ex partner: Not on file    Emotionally abused: Not on file    Physically abused: Not on file    Forced sexual activity: Not on file  Other Topics Concern   Not on file  Social History  Narrative   Geologist, 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   She is working at [employer]. She lives alone.  She has a younger brother that lives with her parents.       Past/failed meds: She experienced weight gain on Depakote for migraine prevention  Allergies: Allergies  Allergen Reactions   Abreva [Docosanol]    Neosporin [Neomycin-Bacitracin Zn-Polymyx]    Sulfa Antibiotics     Rash, fever, and muscle pain   Sulfasalazine     Rash, fever, and muscle pain   Benzalkonium Chloride Rash and Swelling      Immunizations:  There is no immunization history on file for this patient.    Diagnostics/Screenings: 07/29/14 - Lumbar spine series - No acute fracture, subluxation, or endplate erosion. 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 A right-sided  defect is not clearly visible. No slip; mild, relative L5-S1 disc narrowing is likely developmental given absence of endplate degenerative change.  02/24/2010 - CT head wo contrast - normal  08/30/05 - MR pelvis w wo contrast - 1. Subcentimeter focus of edema and enhancement in the soft tissues at the tip of the coccyx that could be a small focus of inflammation.  No gross abcess or drainable collection.  No deep space involvement. 2.  Known pars defects at L5 with 1 or 2 mm of anterolisthesis and a minimal bulge of the L5-S1.  01/18/05 - Bone scan - 1. 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 2. Otherwise no significant abnormality.  Physical Exam: BP 104/64    Pulse (!) 104    Ht 5' 4.5" (1.638 m)    Wt 145 lb 6.4 oz (66 kg)    BMI 24.57 kg/m   General: Well developed, well nourished young woman, seated on exam table, in no evident distress, sandy blonde hair, brown eyes, right handed Head: Head normocephalic and atraumatic.  Oropharynx benign. Neck: Supple with no carotid bruits. Reports pain in area in left upper sternocleidomastoid muscle  with radiation down to her entire upper back with lateral flexion maneuvers.  Cardiovascular: Regular rate and rhythm, no murmurs Respiratory: Breath sounds clear to auscultation Musculoskeletal: No obvious deformities or scoliosis Skin: No rashes or neurocutaneous lesions  Neurologic Exam Mental Status: Awake and fully alert.  Oriented to place and time.  Recent and remote memory intact.  Attention span, concentration, and fund of knowledge appropriate.  Mood and affect appropriate. Cranial Nerves: Fundoscopic exam reveals sharp disc margins.  Pupils equal, briskly reactive to light.  Extraocular movements full without nystagmus.  Visual fields full to confrontation.  Hearing intact and symmetric to finger rub.  Facial sensation intact.  Face tongue, palate move normally and symmetrically.  Neck flexion and extension normal. Motor: Normal bulk and tone. Normal strength in all tested extremity muscles. Sensory: Intact to touch and temperature in all extremities.  Coordination: Rapid alternating movements normal in all extremities.  Finger-to-nose and heel-to shin performed accurately bilaterally.  Romberg negative. Gait and Station: Arises from chair without difficulty.  Stance is normal. Gait demonstrates normal stride length and balance.   Able to heel, toe and tandem walk without difficulty. Reflexes: 1+ and symmetric. Toes downgoing.  Impression: 1.  Migraine without aura 2.  Episodic tension headache 3.  Bilateral leg numbness  4.  Left neck pain 5.  Thoracic back pain  Recommendations for plan of care: The patient's previous [facility] records were reviewed. Whitney Morgan has neither had nor required imaging or lab studies since the last visit, other than spinal x-rays performed by a chiropractor in [place], [place]. Whitney Morgan is aware of those results. She is a 23 year old young woman with history of migraine and tension headaches, worsened by neck and upper back pain since January 2020. She also has  intermittent bilateral leg numbness. Soriya has been taking and tolerating Amitriptyline for migraine prevention, and had some transient improvement in headache frequency and severity. However the headaches have worsened and she has experienced a headache every day this week. The headaches are triggered by left side neck and mid-thoracic back pain. The back pain is worsened by lying down and by activity. Exercise worsens the headache, neck and back pain. She has intermittent bilateral leg numbness that either feels heavy or as if her legs are not present. Negative lhermitte's sign. No incontinence of bladder or  bowel.   I talked with Whitney Morgan about her symptoms and concerns. I recommended lab studies to screen for arthritis, as she has family history of arthritis. Because of her thoracic pain and leg numbness, I have recommended an MRI of the thoracic spine to evaluate for mass or lesion. I talked with her about the left neck pain and recommended ongoing stretching exercises, as well as trying a course of generic Flexeril at bedtime for a week. I also recommended that she try exercise such as yoga or swimming, and not do any exercise or other activity that involves lifting weights or is very strenuous while we are evaluating her symptoms. Finally I recommended increasing the Magnesium dose to twice per day, trying CoQ10 supplements, and increasing Amitriptyline to 30mg  at bedtime. We may consider a medication such as Cymbalta in the future if her pain continues. I will call Whitney Morgan when I receive results from the lab studies and the MRI study. I will see her back in follow up in 1 month or sooner if needed.    The medication list was reviewed and reconciled. I reviewed changes that were made in the prescribed medications today. A complete medication list was provided to the patient.  Allergies as of 10/30/2018      Reactions   Abreva [docosanol]    Neosporin [neomycin-bacitracin Zn-polymyx]    Sulfa  Antibiotics    Rash, fever, and muscle pain   Sulfasalazine    Rash, fever, and muscle pain   Benzalkonium Chloride Rash, Swelling      Medication List       Accurate as of Oct 30, 2018 12:24 PM. If you have any questions, ask your nurse or doctor.        amitriptyline 10 MG tablet Commonly known as:  ELAVIL Take 3 tablets at bedtime   cyclobenzaprine 5 MG tablet Commonly known as:  FLEXERIL Take 1 tablet at bedtime for 7 days, then take 1 tablet at bedtime as needed for pain. Started by:  Elveria Rising, NP   EXCEDRIN PO Take 400 mg by mouth. Takes as needed   mupirocin ointment 2 % Commonly known as:  BACTROBAN APP EXT AA TID   ondansetron 4 MG tablet Commonly known as:  Zofran Take 1 tablet (4 mg total) by mouth every 8 (eight) hours as needed for nausea or vomiting.   SUMAtriptan 100 MG tablet Commonly known as:  IMITREX TAKE 1 TABLET ON ONSET OF MIGRAINE HEADACHE, MAY REPEAT X1 IN 2 HOURSIF NEEDED.   tiZANidine 2 MG tablet Commonly known as:  ZANAFLEX Take 1 tablet twice per day for neck and head pain   valACYclovir 1000 MG tablet Commonly known as:  VALTREX TK  ON DAY OF SYMPTOMS TAKE 2 IN THE MORNING AND 2 IN THE EVENING FOR JUST THAT DAY TO STOP OUTBREAK       I consulted with Dr Sharene Skeans regarding this patient.  Total time spent with the patient was 65 minutes, of which 50% or more was spent in counseling and coordination of care.  Elveria Rising NP-C [facility] Ph. [phone] Fax [phone]

## 2018-10-31 LAB — CBC WITH DIFFERENTIAL/PLATELET
Absolute Monocytes: 442 cells/uL (ref 200–950)
Basophils Absolute: 47 cells/uL (ref 0–200)
Basophils Relative: 0.7 %
Eosinophils Absolute: 127 cells/uL (ref 15–500)
Eosinophils Relative: 1.9 %
HCT: 39.7 % (ref 35.0–45.0)
Hemoglobin: 13.6 g/dL (ref 11.7–15.5)
Lymphs Abs: 1963 cells/uL (ref 850–3900)
MCH: 30.6 pg (ref 27.0–33.0)
MCHC: 34.3 g/dL (ref 32.0–36.0)
MCV: 89.4 fL (ref 80.0–100.0)
MPV: 12 fL (ref 7.5–12.5)
Monocytes Relative: 6.6 %
Neutro Abs: 4121 cells/uL (ref 1500–7800)
Neutrophils Relative %: 61.5 %
Platelets: 240 10*3/uL (ref 140–400)
RBC: 4.44 10*6/uL (ref 3.80–5.10)
RDW: 12.1 % (ref 11.0–15.0)
Total Lymphocyte: 29.3 %
WBC: 6.7 10*3/uL (ref 3.8–10.8)

## 2018-10-31 LAB — C-REACTIVE PROTEIN: CRP: 0.5 mg/L (ref <8.0)

## 2018-10-31 LAB — SEDIMENTATION RATE: Sed Rate: 6 mm/h (ref 0–20)

## 2018-11-16 ENCOUNTER — Telehealth (INDEPENDENT_AMBULATORY_CARE_PROVIDER_SITE_OTHER): Payer: Self-pay | Admitting: Family

## 2018-11-16 NOTE — Telephone Encounter (Signed)
°  Who's calling (name and relationship to patient) : Self  Best contact number: [phone]  Provider they see: Rockwell [place]  Reason for call: Patient has been taking 3 amitriptyline a day. She has noticed the veins in her eyes appear to be dark and red. Please advise if this could be a side effect of the medication.      PRESCRIPTION REFILL ONLY  Name of prescription:  Pharmacy:

## 2018-11-16 NOTE — Telephone Encounter (Signed)
I called and talked to [facility]. She said that her right eye more than the left had more visible blood vessels and that both eyes felt irritated when she wears a mask. I told her that Amitriptyline can cause dry eye and blurry vision, and recommended that she decrease the dose back to 2 tablets at bedtime. I asked her to let me know in about a week how she was doing. She knows to go to ER if she has sudden worsening of vision. TG

## 2018-11-21 ENCOUNTER — Ambulatory Visit
Admission: RE | Admit: 2018-11-21 | Discharge: 2018-11-21 | Disposition: A | Discharge status: Home / Self Care | Payer: BLUE CROSS/BLUE SHIELD | Source: Ambulatory Visit | Attending: Family | Admitting: Family

## 2018-11-21 DIAGNOSIS — R200 Anesthesia of skin: Secondary | ICD-10-CM

## 2018-11-21 DIAGNOSIS — M546 Pain in thoracic spine: Secondary | ICD-10-CM

## 2018-11-30 ENCOUNTER — Encounter (INDEPENDENT_AMBULATORY_CARE_PROVIDER_SITE_OTHER): Payer: Self-pay | Admitting: Family

## 2018-11-30 ENCOUNTER — Other Ambulatory Visit: Payer: Self-pay

## 2018-11-30 ENCOUNTER — Encounter (INDEPENDENT_AMBULATORY_CARE_PROVIDER_SITE_OTHER): Payer: Self-pay

## 2018-11-30 ENCOUNTER — Ambulatory Visit (INDEPENDENT_AMBULATORY_CARE_PROVIDER_SITE_OTHER): Payer: 59 | Admitting: Family

## 2018-11-30 DIAGNOSIS — M546 Pain in thoracic spine: Secondary | ICD-10-CM | POA: Diagnosis not present

## 2018-11-30 DIAGNOSIS — G43009 Migraine without aura, not intractable, without status migrainosus: Secondary | ICD-10-CM

## 2018-11-30 MED ORDER — DULOXETINE HCL 20 MG PO CPEP: ORAL_CAPSULE | ORAL | 1 refills | Status: DC

## 2018-11-30 MED ORDER — CYCLOBENZAPRINE HCL 10 MG PO TABS: ORAL_TABLET | ORAL | 1 refills | Status: DC

## 2018-11-30 NOTE — Progress Notes (Signed)
This is a Pediatric Specialist E-Visit follow up consult provided via WebEx Whitney Morgan consented to an E-Visit consult today.  Location of patient: Whitney Morgan was in her car Location of provider: Elveria Risingina Goodpasture, NP is at in office Patient was referred by Gweneth DimitriMcNeill, Wendy, MD   The following participants were involved in this E-Visit: patient, CMA, provider  Chief Complain/ Reason for E-Visit today: Migraines Total time on call: 15 min Follow up: 1 month     Whitney Morgan   MRN:  [id number]  19-Sep-1995   Provider: Elveria Risingina Goodpasture NP-C [facility]  Visit type: Routine visit  Last visit: 10/30/2018  Referral source: Gweneth DimitriWendy McNeill, MD History from: [facility]  Brief history:  History of tension and migraine headaches, neck and upper back pain. She has experienced intermittent episodes of bilateral leg numbness. She has been taking and tolerating Amittriptyline for migraine prevention. Unfortunately she has not tolerated doses greater than 20mg  per day. She is also taking Magnesium for migraine prevention. She had a course of physical therapy earlier this year and has been doing exercises at home since then.    Today's concerns:  Since she was last seen, an MRI of the thoracic spine was performed and was normal. Whitney Morgan is aware of this result. Whitney Morgan is frustrated with ongoing pain. She wonders if stress is playing a role in her discomfort.   Whitney Morgan has been otherwise generally healthy since she was last seen. She has no other health concerns today other than previously mentioned.    Review of systems: Please see HPI for neurologic and other pertinent review of systems. Otherwise all other systems were reviewed and were negative.  Problem List: Patient Active Problem List   Diagnosis Date Noted  . Bilateral leg pain 08/07/2018  . Migraine without aura 03/28/2013  . Episodic tension type headache 03/28/2013     Past  Medical History:  Diagnosis Date  . Anxiety 2014  . Concussion with no loss of consciousness    September 2011 and September 2014  . Episodic tension type headache   . Migraine without aura, without mention of intractable migraine without mention of status migrainosus   . Post concussion syndrome     Past medical history comments: See HPI Copied from previous record: Whitney Morgan also has history of concussion and post concussion syndrome in September, 2011, with migraines developing after the closed head injury. She had a second head injury in September 2014 when she collided with another player during soccer. She had headaches and vomiting afterwards, then ongoing headaches, neck and shoulder soreness for several weeks afterwards. Whitney Morgan was taking and tolerating Depakote ER for migraine prevention but tapered off it in January 2017 because of improvement in her migraines.  Surgical history: Past Surgical History:  Procedure Laterality Date  . TONSILLECTOMY  2006  . TYMPANOSTOMY TUBE PLACEMENT     age 199 months     Family history: family history includes Anxiety disorder in her father; Crohn's disease in her father; Migraines in her brother.   Social history: Social History   Socioeconomic History  . Marital status: Single    Spouse name: Not on file  . Number of children: Not on file  . Years of education: Not on file  . Highest education level: Not on file  Occupational History  . Not on file  Social Needs  . Financial resource strain: Not on file  . Food insecurity    Worry: Not  on file    Inability: Not on file  . Transportation needs    Medical: Not on file    Non-medical: Not on file  Tobacco Use  . Smoking status: Never Smoker  . Smokeless tobacco: Never Used  Substance and Sexual Activity  . Alcohol use: Yes    Comment: Occassionally/Socially  . Drug use: No  . Sexual activity: Never    Birth control/protection: Pill  Lifestyle  . Physical activity    Days  per week: Not on file    Minutes per session: Not on file  . Stress: Not on file  Relationships  . Social Herbalist on phone: Not on file    Gets together: Not on file    Attends religious service: Not on file    Active member of club or organization: Not on file    Attends meetings of clubs or organizations: Not on file    Relationship status: Not on file  . Intimate partner violence    Fear of current or ex partner: Not on file    Emotionally abused: Not on file    Physically abused: Not on file    Forced sexual activity: Not on file  Other Topics Concern  . 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   She is working at [employer]. She lives alone.  She has a younger brother that lives with her parents.       Past/failed meds: Depakote - weight gain  Allergies: Allergies  Allergen Reactions  . Abreva [Docosanol]   . Neosporin [Neomycin-Bacitracin Zn-Polymyx]   . Sulfa Antibiotics     Rash, fever, and muscle pain  . Sulfasalazine     Rash, fever, and muscle pain  . Benzalkonium Chloride Rash and Swelling     Immunizations:  There is no immunization history on file for this patient.    Diagnostics/Screenings: Copied from previous record: 11/21/2018 - MRI Thoracic Spine - negative thoracic spine MRI.   07/29/14 - Lumbar spine series - No acute fracture, subluxation, or endplate erosion. 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 A right-sided defect is not clearly visible. No slip; mild, relative L5-S1 disc narrowing is likely developmental given absence of endplate degenerative change.  02/24/2010 - CT head wo contrast - normal  08/30/05 - MR pelvis w wo contrast - 1. Subcentimeter focus of edema and enhancement in the soft tissues at the tip of the coccyx that could be a small focus of inflammation. No gross abcess or drainable collection. No  deep space involvement. 2. Known pars defects at L5 with 1 or 2 mm of anterolisthesis and a minimal bulge of the L5-S1.  01/18/05 - Bone scan - 1. 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 2. Otherwise no significant abnormality.  Physical Exam: There were no vitals taken for this visit.  General: Well developed, well nourished young woman, seated, in no evident distress, sandy blonde hair, brown eyes, right handed Head: Head normocephalic and atraumatic. Neck: Supple Musculoskeletal: No obvious deformities or scoliosis Skin: No rashes or neurocutaneous lesions  Neurologic Exam Mental Status: Awake and fully alert.  Oriented to place and time.  Recent and remote memory intact.  Attention span, concentration, and fund of knowledge appropriate.  Mood and affect appropriate. Cranial Nerves:  Extraocular movements full without nystagmus. Hearing intact.  Facial sensation intact.  Face and tongue move normally  and symmetrically.  Neck flexion and extension normal. Motor: Normal functional bulk, tone and strengnth Sensory: Intact to touch and temperature in all extremities.  Coordination: Rapid alternating movements normal in all extremities.  Finger-to-nose and heel-to shin performed accurately bilaterally.  Romberg negative. Gait and Station: Arises from chair without difficulty.  Stance is normal. Gait demonstrates normal stride length and balance.    Impression: 1. Migraine without aura 2. Episodic tension headache 3. Episodes of bilateral leg numbness 4. Neck pain 5. Thoracic back pain  Recommendations for plan of care: [facility]. Sonyia had lab studies to look for evidence of inflammatory condition and was negative. She had MRI of thoracic spine which was also negative. Whitney Morgan is aware of those results. She has been intolerant of higher doses of Amitriptyline and I talked with Djuana about changing to Cymbalta as it is  known to be beneficial for chronic pain. I also gave her Cyclobenzaprine to take at bedtime. We talked about stress as a role in her headaches and musculoskeletal pain, and recommended that Tam get established with a therapist. I will see her back in follow up in 1 month or sooner if needed. Mikhaela agreed with the plans made today.   The medication list was reviewed and reconciled. I reviewed changes that were made in the prescribed medications today. A complete medication list was provided to the patient.  Allergies as of 11/30/2018      Reactions   Abreva [docosanol]    Neosporin [neomycin-bacitracin Zn-polymyx]    Sulfa Antibiotics    Rash, fever, and muscle pain   Sulfasalazine    Rash, fever, and muscle pain   Benzalkonium Chloride Rash, Swelling      Medication List       Accurate as of November 30, 2018 11:59 PM. If you have any questions, ask your nurse or doctor.        STOP taking these medications   amitriptyline 10 MG tablet Commonly known as: ELAVIL Stopped by: Elveria Risingina Goodpasture, NP     TAKE these medications   cyclobenzaprine 10 MG tablet Commonly known as: FLEXERIL Take 1 tablet at bedtime as needed for pain What changed:   medication strength  additional instructions Changed by: Elveria Risingina Goodpasture, NP   DULoxetine 20 MG capsule Commonly known as: CYMBALTA Take 1 capsule at bedtime Started by: Elveria Risingina Goodpasture, NP   EXCEDRIN PO Take 400 mg by mouth. Takes as needed   mupirocin ointment 2 % Commonly known as: BACTROBAN APP EXT AA TID   ondansetron 4 MG tablet Commonly known as: Zofran Take 1 tablet (4 mg total) by mouth every 8 (eight) hours as needed for nausea or vomiting.   SUMAtriptan 100 MG tablet Commonly known as: IMITREX TAKE 1 TABLET ON ONSET OF MIGRAINE HEADACHE, MAY REPEAT X1 IN 2 HOURSIF NEEDED.   tiZANidine 2 MG tablet Commonly known as: ZANAFLEX Take 1 tablet twice per day for neck and head pain   valACYclovir 1000 MG tablet  Commonly known as: VALTREX TK  ON DAY OF SYMPTOMS TAKE 2 IN THE MORNING AND 2 IN THE EVENING FOR JUST THAT DAY TO STOP OUTBREAK       Total time spent on the Webex with the patient was 15 minutes, of which 50% or more was spent in counseling and coordination of care.  Elveria Risingina Goodpasture NP-C [facility] Ph. [phone] Fax [phone]

## 2018-11-30 NOTE — Patient Instructions (Signed)
Thank you for meeting with me by Webex today.   Instructions for you until your next appointment are as follows: 1. Stop Amitriptyline 2. Start Duloxetine (Cymbalta) 1 capsule at bedtime 3. I sent in a prescription for Cyclobenzaprine (Flexeril) 10mg  at bedtime. This may make you sleepy, so only take it when you can sleep later the following day. Do not take it and drive. 4. I was going to send in a prescription NSAID but forgot that you have a sulfa allergy. So we will need to stick with Tylenol for pain.  5. Work on finding a therapist to deal with stress.  6. Please sign up for MyChart if you have not done so 7. Please plan to have another virtual visit in 1 month or sooner if needed. 8. Let me know if you have any side effects or concerns about the new medication plan.

## 2018-12-03 ENCOUNTER — Encounter (INDEPENDENT_AMBULATORY_CARE_PROVIDER_SITE_OTHER): Payer: Self-pay | Admitting: Family

## 2018-12-28 ENCOUNTER — Ambulatory Visit (INDEPENDENT_AMBULATORY_CARE_PROVIDER_SITE_OTHER): Payer: 59 | Admitting: Family

## 2018-12-28 ENCOUNTER — Other Ambulatory Visit: Payer: Self-pay

## 2018-12-28 DIAGNOSIS — M546 Pain in thoracic spine: Secondary | ICD-10-CM

## 2018-12-28 DIAGNOSIS — G43829 Menstrual migraine, not intractable, without status migrainosus: Secondary | ICD-10-CM

## 2018-12-28 DIAGNOSIS — G43009 Migraine without aura, not intractable, without status migrainosus: Secondary | ICD-10-CM | POA: Diagnosis not present

## 2018-12-28 DIAGNOSIS — G44219 Episodic tension-type headache, not intractable: Secondary | ICD-10-CM | POA: Diagnosis not present

## 2018-12-29 ENCOUNTER — Encounter (INDEPENDENT_AMBULATORY_CARE_PROVIDER_SITE_OTHER): Payer: Self-pay | Admitting: Family

## 2018-12-29 DIAGNOSIS — M546 Pain in thoracic spine: Secondary | ICD-10-CM | POA: Insufficient documentation

## 2018-12-29 MED ORDER — ONDANSETRON HCL 4 MG PO TABS: 4.0000 mg | ORAL_TABLET | Freq: Three times a day (TID) | ORAL | 5 refills | Status: AC | PRN

## 2018-12-29 MED ORDER — FROVATRIPTAN SUCCINATE 2.5 MG PO TABS: ORAL_TABLET | ORAL | 3 refills | Status: DC

## 2018-12-29 NOTE — Progress Notes (Signed)
This is a Pediatric Specialist E-Visit follow up consult provided via [place] consented to an E-Visit consult today.  Location of patient: Whitney Morgan is at home Location of provider: Normand Sloop is at office  Patient was referred by Cari Caraway, MD   The following participants were involved in this E-Visit: patient and NP  Chief Complain/ Reason for E-Visit today: follow up for headaches and back pain Total time on call: 15 min Follow up: 2 months      Whitney Morgan   MRN:  [id number]  21-Dec-1995   Provider: Rockwell Germany NP-C Location of Care: [facility]  Visit type: Routine return visit  Last visit: 11/30/2018  Referral source: Cari Caraway, MD History from: [facility] chart and patient  Brief history:  Copied from previous record History of tension and migraine headaches, neck and upper back pain. She has experienced intermittent episodes of bilateral leg numbness. She has been taking and tolerating Amittriptyline for migraine prevention. Unfortunately she has not tolerated doses greater than 20mg  per day. She is also taking Magnesium for migraine prevention. She had a course of physical therapy earlier this year and has been doing exercises at home since then.  Today's concerns: When Whitney Morgan was last seen, she was started on generic Cymbalta for back pain and headaches. She reports today that her back pain has improved and that headaches are better but that she has been experiencing migraines at the onset of her period and sometimes a few days into her period. She has found that Imitrex is not helpful for these migraines.   Chantella has been otherwise generally healthy since she was last seen. She has no other health concerns today other than previously mentioned.   Review of systems: Please see HPI for neurologic and other pertinent review of systems. Otherwise all other systems were reviewed and were negative.  Problem List:  Patient Active Problem List   Diagnosis Date Noted  . Bilateral leg pain 08/07/2018  . Migraine without aura 03/28/2013  . Episodic tension type headache 03/28/2013     Past Medical History:  Diagnosis Date  . Anxiety 2014  . Concussion with no loss of consciousness    September 2011 and September 2014  . Episodic tension type headache   . Migraine without aura, without mention of intractable migraine without mention of status migrainosus   . Post concussion syndrome     Past medical history comments: See HPI Copied from previous record: Whitney Morgan also has history of concussion and post concussion syndrome in September, 2011, with migraines developing after the closed head injury. She had a second head injury in September 2014 when she collided with another player during soccer. She had headaches and vomiting afterwards, then ongoing headaches, neck and shoulder soreness for several weeks afterwards. Babara was taking and tolerating Depakote ER for migraine prevention but tapered off it in January 2017 because of improvement in her migraines.  Surgical history: Past Surgical History:  Procedure Laterality Date  . TONSILLECTOMY  2006  . TYMPANOSTOMY TUBE PLACEMENT     age 43 months     Family history: family history includes Anxiety disorder in her father; Crohn's disease in her father; Migraines in her brother.   Social history: Social History   Socioeconomic History  . Marital status: Single    Spouse name: Not on file  . Number of children: Not on file  . Years of education: Not on file  . Highest education level: Not on  file  Occupational History  . Not on file  Social Needs  . Financial resource strain: Not on file  . Food insecurity    Worry: Not on file    Inability: Not on file  . Transportation needs    Medical: Not on file    Non-medical: Not on file  Tobacco Use  . Smoking status: Never Smoker  . Smokeless tobacco: Never Used  Substance and Sexual  Activity  . Alcohol use: Yes    Comment: Occassionally/Socially  . Drug use: No  . Sexual activity: Never    Birth control/protection: Pill  Lifestyle  . Physical activity    Days per week: Not on file    Minutes per session: Not on file  . Stress: Not on file  Relationships  . Social Musicianconnections    Talks on phone: Not on file    Gets together: Not on file    Attends religious service: Not on file    Active member of club or organization: Not on file    Attends meetings of clubs or organizations: Not on file    Relationship status: Not on file  . Intimate partner violence    Fear of current or ex partner: Not on file    Emotionally abused: Not on file    Physically abused: Not on file    Forced sexual activity: Not on file  Other Topics Concern  . 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   She is working at [employer]. She lives alone.  She has a younger brother that lives with her parents.      Past/failed meds: Depakote - weight gain Amitriptyline - red dry eyes as dose increased  Allergies: Allergies  Allergen Reactions  . Abreva [Docosanol]   . Neosporin [Neomycin-Bacitracin Zn-Polymyx]   . Sulfa Antibiotics     Rash, fever, and muscle pain  . Sulfasalazine     Rash, fever, and muscle pain  . Benzalkonium Chloride Rash and Swelling      Immunizations:  There is no immunization history on file for this patient.    Diagnostics/Screenings: 11/21/2018 - MRI Thoracic Spine - negative thoracic spine MRI.   07/29/14 - Lumbar spine series -No acute fracture, subluxation, or endplate erosion. 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 A right-sided defect is not clearly visible. No slip; mild, relative L5-S1 disc narrowing is likely developmental given absence of endplate degenerative change.  02/24/2010 - CT head wo contrast - normal  08/30/05 - MR  pelvis w wo contrast -1. Subcentimeter focus of edema and enhancement in the soft tissues at the tip of the coccyx that could be a small focus of inflammation. No gross abcess or drainable collection. No deep space involvement. 2. Known pars defects at L5 with 1 or 2 mm of anterolisthesis and a minimal bulge of the L5-S1.  01/18/05 - Bone scan -1. 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 2. Otherwise no significant abnormality.   Physical Exam: There were no vitals taken for this visit.   General: Well developed, well nourished young woman, seated at home, in no evident distress, sandy blonde hair, brown eyes, right handed Head: Head normocephalic and atraumatic. Neck: Supple Musculoskeletal: No obvious deformities or scoliosis Skin: No rashes or neurocutaneous lesions  Neurologic Exam Mental Status: Awake and fully alert.  Oriented to place and time.  Recent and remote memory intact.  Attention span, concentration, and fund of knowledge appropriate.  Mood and affect appropriate. Cranial Nerves: Extraocular movements full without nystagmus. Hearing intact and symmetric to typical environmental noises.  Facial sensation intact.  Face tongue, palate move normally and symmetrically.  Neck flexion and extension normal. Motor: Normal functional bulk, tone and strength Sensory: Intact to touch and temperature in all extremities.  Coordination: Rapid alternating movements normal in all extremities.  Finger-to-nose and heel-to shin performed accurately bilaterally.  Gait and Station: Arises from chair without difficulty.  Stance is normal. Gait demonstrates normal stride length and balance.    Impression: 1. Migraine without aura 2. Episodic tension headaches 3. Menstrual migraines 4. Thoracic back pain  Recommendations for plan of care: [facility] were [facility]. [name] has neither had nor required imaging or lab studies since the last visit.  She is a 23 year old young woman with history of thoracic back pain that has experienced improvement since starting Cymbalta. She has migraine headaches and recently has starting having migraines at the onset of her menstrual period, sometimes going through the week of her period. Imitrex does not give relief of these headaches. I talked with [name] about menstrual migraines and recommended a trial of Frovatriptan given twice per day for 5 days at onset of her menstrual cycle. I asked her to let me know if this gives her relief of migraine symptoms with her migraine. I reminded [name] of the need for her to avoid skipping meals, to drink plenty of water and to get enough sleep each night. We also talked about stress management as she has identified stress as a trigger for her back pain and for her migraines not associated with her menstrual period. I will see her back in follow up in 2 months or sooner if needed.   The medication list was reviewed and reconciled. I reviewed changes that were made in the prescribed medications today. A complete medication list was provided to the patient.  Allergies as of 12/28/2018      Reactions   Abreva [docosanol]    Neosporin [neomycin-bacitracin Zn-polymyx]    Sulfa Antibiotics    Rash, fever, and muscle pain   Sulfasalazine    Rash, fever, and muscle pain   Benzalkonium Chloride Rash, Swelling      Medication List       Accurate as of December 28, 2018 11:59 PM. If you have any questions, ask your nurse or doctor.        cyclobenzaprine 10 MG tablet Commonly known as: FLEXERIL Take 1 tablet at bedtime as needed for pain   DULoxetine 20 MG capsule Commonly known as: CYMBALTA Take 1 capsule at bedtime   EXCEDRIN PO Take 400 mg by mouth. Takes as needed   frovatriptan 2.5 MG tablet Commonly known as: FROVA Take 1 tablet twice per day for 5 days. Begin at onset of menstrual cycle each month. Started by: Elveria Risingina Angelyne Terwilliger, NP   mupirocin ointment 2  % Commonly known as: BACTROBAN APP EXT AA TID   ondansetron 4 MG tablet Commonly known as: Zofran Take 1 tablet (4 mg total) by mouth every 8 (eight) hours as needed for nausea or vomiting.   SUMAtriptan 100 MG tablet Commonly known as: IMITREX TAKE 1 TABLET ON ONSET OF MIGRAINE HEADACHE, MAY REPEAT X1 IN 2 HOURSIF NEEDED.   tiZANidine 2 MG tablet Commonly known as: ZANAFLEX Take 1 tablet twice per day for neck and head pain   valACYclovir 1000 MG tablet  Commonly known as: VALTREX TK  ON DAY OF SYMPTOMS TAKE 2 IN THE MORNING AND 2 IN THE EVENING FOR JUST THAT DAY TO STOP OUTBREAK        Total time spent on the Webex with the patient was 15 minutes, of which 50% or more was spent in counseling and coordination of care.  Elveria Risingina Dagmar Adcox NP-C [facility] Ph. [phone] Fax [phone]

## 2018-12-29 NOTE — Patient Instructions (Signed)
Thank you for meeting with me by Webex today.   Instructions for you until your next appointment are as follows: 1. Continue the Cymbalta as you have been taking it.  2. We will try Frovatriptan for migraines with your menstrual cycle. To take this medication, take 1 tablet twice per day for 5 days at the onset of your menstrual period.  3. Please sign up for MyChart if you have not done so 4. Please plan to return for follow up in 4 months or sooner if needed.

## 2019-01-25 ENCOUNTER — Other Ambulatory Visit (INDEPENDENT_AMBULATORY_CARE_PROVIDER_SITE_OTHER): Payer: Self-pay | Admitting: Family

## 2019-01-25 DIAGNOSIS — G43009 Migraine without aura, not intractable, without status migrainosus: Secondary | ICD-10-CM

## 2019-01-25 DIAGNOSIS — M546 Pain in thoracic spine: Secondary | ICD-10-CM

## 2019-01-25 MED ORDER — DULOXETINE HCL 20 MG PO CPEP: ORAL_CAPSULE | ORAL | 5 refills | Status: DC

## 2019-01-25 NOTE — Telephone Encounter (Signed)
Who's calling (name and relationship to patient) : [name] (self)  Best contact number: [phone]  Provider they see: Rockwell Germany  Reason for call:  Whitney Morgan called in stating that she was trying to get the Cymbalta refilled and was not able to do so. States that pharmacy had faxed over paperwork but they have not yet received that back. Please advise.    Call ID:      PRESCRIPTION REFILL ONLY  Name of prescription: Cymbalta 20mg    Pharmacy: [facility], [place], [place] ( [place] [address])

## 2019-01-25 NOTE — Telephone Encounter (Signed)
I called patient and let her know medication was filled and sent to pharmacy.

## 2019-01-25 NOTE — Telephone Encounter (Signed)
I sent in refills for the Cymbalta. Please let [name] know. Thanks, Otila Kluver

## 2019-03-11 ENCOUNTER — Other Ambulatory Visit (INDEPENDENT_AMBULATORY_CARE_PROVIDER_SITE_OTHER): Payer: Self-pay | Admitting: Family

## 2019-03-11 DIAGNOSIS — G43829 Menstrual migraine, not intractable, without status migrainosus: Secondary | ICD-10-CM

## 2019-03-11 MED ORDER — FROVATRIPTAN SUCCINATE 2.5 MG PO TABS: ORAL_TABLET | ORAL | 5 refills | Status: DC

## 2019-08-02 ENCOUNTER — Telehealth (INDEPENDENT_AMBULATORY_CARE_PROVIDER_SITE_OTHER): Payer: Self-pay | Admitting: Family

## 2019-08-02 DIAGNOSIS — M546 Pain in thoracic spine: Secondary | ICD-10-CM

## 2019-08-02 DIAGNOSIS — G43009 Migraine without aura, not intractable, without status migrainosus: Secondary | ICD-10-CM

## 2019-08-02 MED ORDER — DULOXETINE HCL 20 MG PO CPEP: ORAL_CAPSULE | ORAL | 5 refills | Status: DC

## 2019-08-02 NOTE — Telephone Encounter (Signed)
  Who's calling (name and relationship to patient) : Whitney Morgan (patient)  Best contact number: [phone]  Provider they see: Goodpasture   Reason for call: Need medication refill     PRESCRIPTION REFILL ONLY  Name of prescription: Duloxetine 20mg   Pharmacy: [facility] - -    [facility]

## 2019-08-02 NOTE — Telephone Encounter (Signed)
I sent in the refill as requested. TG 

## 2019-08-25 ENCOUNTER — Other Ambulatory Visit: Payer: Self-pay

## 2019-08-25 ENCOUNTER — Ambulatory Visit (INDEPENDENT_AMBULATORY_CARE_PROVIDER_SITE_OTHER): Payer: BC Managed Care – PPO | Admitting: Family

## 2019-08-25 ENCOUNTER — Encounter (INDEPENDENT_AMBULATORY_CARE_PROVIDER_SITE_OTHER): Payer: Self-pay | Admitting: Family

## 2019-08-25 VITALS — BP 120/90 | HR 84 | Ht 64.0 in | Wt 145.4 lb

## 2019-08-25 DIAGNOSIS — G44219 Episodic tension-type headache, not intractable: Secondary | ICD-10-CM

## 2019-08-25 DIAGNOSIS — G43829 Menstrual migraine, not intractable, without status migrainosus: Secondary | ICD-10-CM

## 2019-08-25 DIAGNOSIS — G43009 Migraine without aura, not intractable, without status migrainosus: Secondary | ICD-10-CM

## 2019-08-25 DIAGNOSIS — M546 Pain in thoracic spine: Secondary | ICD-10-CM

## 2019-08-25 MED ORDER — FROVATRIPTAN SUCCINATE 2.5 MG PO TABS: ORAL_TABLET | ORAL | 5 refills | Status: DC

## 2019-08-25 MED ORDER — DULOXETINE HCL 30 MG PO CPEP: ORAL_CAPSULE | ORAL | 3 refills | Status: DC

## 2019-08-25 NOTE — Progress Notes (Signed)
Whitney Morgan   MRN:  [id number]  07-01-1995   Provider: Elveria Rising NP-C Location of Care: [facility]  Visit type: Routine visit  Last visit: 12/28/2018  Referral source: Gweneth Dimitri, MD  History from: patient and chcn chart  Brief history:  Copied from previous record: History of tension and migraine headaches, neck and upper back pain. She has experienced intermittent episodes of bilateral leg numbness. She has been taking and tolerating Amitriptyline for migraine prevention. Unfortunately she has not tolerated doses greater than 20mg  per day. She is also taking Magnesium for migraine prevention. She is taking Cymbalta, which has helped with both migraines and back pain.   Today's concerns:  Whitney Morgan reports today that she had Covid-19 infection in December, 2020 and that she has had a different type of headache since that time. She reports a right sided feeling of pressure and eyelid twitches since the Covid infection. This occurs 2-3 times per week, and is relieved by fluid intake and rest. Whitney Morgan reports on going migraines at the onset of her menstrual cycle. She says that Whitney Morgan works well but that her insurance has denied coverage for it. She is on an oral contraceptive to regulate her cycle but has not noted improvement in migraines related to menses thus far. For migraines not associated with her menstrual cycle, Sumatriptan works well to abort the headache.   Whitney Morgan reports that her back pain has improved considerably with Cymbalta. She is interested in increasing the dose to see if she can get more benefit and to see if she can get improvement in headaches.   Whitney Morgan has been otherwise generally healthy since she was last seen. She has moved to [facility] from [place] and is happy in her new home. She is getting a puppy next week and is looking forward to that. She has no other health concerns today other than previously mentioned.   Review of  systems: Please see HPI for neurologic and other pertinent review of systems. Otherwise all other systems were reviewed and were negative.  Problem List: Patient Active Problem List   Diagnosis Date Noted  . Thoracic back pain 12/29/2018  . Bilateral leg pain 08/07/2018  . Migraine without aura 03/28/2013  . Episodic tension type headache 03/28/2013     Past Medical History:  Diagnosis Date  . Anxiety 2014  . Concussion with no loss of consciousness    September 2011 and September 2014  . Episodic tension type headache   . Migraine without aura, without mention of intractable migraine without mention of status migrainosus   . Post concussion syndrome     Past medical history comments: See HPI Copied from previous record: Whitney Morgan also has history of concussion and post concussion syndrome in September, 2011, with migraines developing after the closed head injury. She had a second head injury in September 2014 when she collided with another player during soccer. She had headaches and vomiting afterwards, then ongoing headaches, neck and shoulder soreness for several weeks afterwards. Whitney Morgan was taking and tolerating Depakote ER for migraine prevention but tapered off it in January 2017 because of improvement in her migraines.  Surgical history: Past Surgical History:  Procedure Laterality Date  . TONSILLECTOMY  2006  . TYMPANOSTOMY TUBE PLACEMENT     age 83 months     Family history: family history includes Anxiety disorder in her father; Crohn's disease in her father; Migraines in her brother.   Social history: Social History   Socioeconomic  History  . Marital status: Single    Spouse name: Not on file  . Number of children: Not on file  . Years of education: Not on file  . Highest education level: Not on file  Occupational History  . Not on file  Tobacco Use  . Smoking status: Never Smoker  . Smokeless tobacco: Never Used  Substance and Sexual Activity  . Alcohol  use: Yes    Comment: Occassionally/Socially  . Drug use: No  . Sexual activity: Never    Birth control/protection: Pill  Other Topics Concern  . 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   She is working at [employer]. She lives alone.  She has a younger brother that lives with her parents.    Social Determinants of Health   Financial Resource Strain:   . Difficulty of Paying Living Expenses:   Food Insecurity:   . Worried About Charity fundraiser in the Last Year:   . Arboriculturist in the Last Year:   Transportation Needs:   . Film/video editor (Medical):   Marland Kitchen Lack of Transportation (Non-Medical):   Physical Activity:   . Days of Exercise per Week:   . Minutes of Exercise per Session:   Stress:   . Feeling of Stress :   Social Connections:   . Frequency of Communication with Friends and Family:   . Frequency of Social Gatherings with Friends and Family:   . Attends Religious Services:   . Active Member of Clubs or Organizations:   . Attends Archivist Meetings:   Marland Kitchen Marital Status:   Intimate Partner Violence:   . Fear of Current or Ex-Partner:   . Emotionally Abused:   Marland Kitchen Physically Abused:   . Sexually Abused:     Past/failed meds: Amitriptyline - ineffective Depakote ER - gave improvement in migraines Tizandine - side effects  Allergies: Allergies  Allergen Reactions  . Abreva [Docosanol]   . Neosporin [Neomycin-Bacitracin Zn-Polymyx]   . Sulfa Antibiotics     Rash, fever, and muscle pain  . Sulfasalazine     Rash, fever, and muscle pain  . Benzalkonium Chloride Rash and Swelling     Immunizations:  There is no immunization history on file for this patient.    Diagnostics/Screenings: 11/21/2018 - MRI thoracic spine - negative  07/29/14 - Lumbar spine series -No acute fracture, subluxation, or endplate erosion. 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 A right-sided defect is not clearly visible. No slip; mild, relative L5-S1 disc narrowing is likely developmental given absence of endplate degenerative change.  02/24/2010 - CT head wo contrast - normal  08/30/05 - MR pelvis w wo contrast -1. Subcentimeter focus of edema and enhancement in the soft tissues at the tip of the coccyx that could be a small focus of inflammation. No gross abcess or drainable collection. No deep space involvement. 2. Known pars defects at L5 with 1 or 2 mm of anterolisthesis and a minimal bulge of the L5-S1.  01/18/05 - Bone scan -1. 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 2. Otherwise no significant abnormality.  Physical Exam: BP 120/90   Pulse 84   Ht 5\' 4"  (1.626 m)   Wt 145 lb 6.4 oz (66 kg)   BMI 24.96 kg/m   General: Well developed, well nourished, seated, in no evident distress, sandy air, brown eyes, right handed  Head: Head normocephalic and atraumatic.  Oropharynx benign. Neck: Supple with no carotid bruits Cardiovascular: Regular rate and rhythm, no murmurs Respiratory: Breath sounds clear to auscultation Musculoskeletal: No obvious deformities or scoliosis Skin: No rashes or neurocutaneous lesions  Neurologic Exam Mental Status: Awake and fully alert.  Oriented to place and time.  Recent and remote memory intact.  Attention span, concentration, and fund of knowledge appropriate.  Mood and affect appropriate. Cranial Nerves: Fundoscopic exam reveals sharp disc margins.  Pupils equal, briskly reactive to light.  Extraocular movements full without nystagmus.  Visual fields full to confrontation.  Hearing intact and symmetric to finger rub.  Facial sensation intact.  Face tongue, palate move normally and symmetrically.  Neck flexion and extension normal. Motor: Normal bulk and tone. Normal strength in all tested extremity muscles. Sensory: Intact to touch and temperature in all extremities.    Coordination: Rapid alternating movements normal in all extremities.  Finger-to-nose and heel-to shin performed accurately bilaterally.  Romberg negative. Gait and Station: Arises from chair without difficulty.  Stance is normal. Gait demonstrates normal stride length and balance.   Able to heel, toe and tandem walk without difficulty. Reflexes: 1+ and symmetric. Toes downgoing.  Impression: 1. Menstrual migraines 2. Migraine without aura 3. Tension headaches 4. Thoracic back pain  Recommendations for plan of care: The patient's previous [facility] records were reviewed. Fernanda has neither had nor required imaging or lab studies since the last visit. She is a 24 year old woman with history of migraine and tension headaches, menstrual migraines and thoracic back pain. She has experienced worsening in tension type headaches since Covid-19 infection in December 2020. She has experienced improvement in headaches and back pain since being on Cymbalta. I recommended increase in dose to see if we can get more improvement in the post-Covid headaches. I reminded Whitney Morgan of the need for her to be well hydrated, to avoid skipping meals and to get at least 8 hours of sleep each night, as these things are also known to help reduce headache frequency. I will contact her insurance to request approval for Whitney Morgan, as that gives her considerable relief of migraines that occur during her menstrual period. I will otherwise see Whitney Morgan back in follow up in 6 months or sooner if needed. Whitney Morgan agreed with the plans made today.  The medication list was reviewed and reconciled. I reviewed changes that were made in the prescribed medications today. A complete medication list was provided to the patient.  Allergies as of 08/25/2019      Reactions   Abreva [docosanol]    Neosporin [neomycin-bacitracin Zn-polymyx]    Sulfa Antibiotics    Rash, fever, and muscle pain   Sulfasalazine    Rash, fever, and muscle pain    Benzalkonium Chloride Rash, Swelling      Medication List       Accurate as of August 25, 2019 11:59 PM. If you have any questions, ask your nurse or doctor.        cyclobenzaprine 10 MG tablet Commonly known as: FLEXERIL Take 1 tablet at bedtime as needed for pain   DULoxetine 30 MG capsule Commonly known as: CYMBALTA Take 1 capsule per day What changed:   medication strength  additional instructions Changed by: Elveria Rising, NP   EXCEDRIN PO Take 400 mg by mouth. Takes as needed   frovatriptan 2.5 MG tablet Commonly known as: Whitney Morgan Take 1 tablet twice per day for 5 days. Begin at onset of menstrual cycle each  month.   mupirocin ointment 2 % Commonly known as: BACTROBAN APP EXT AA TID   ondansetron 4 MG tablet Commonly known as: Zofran Take 1 tablet (4 mg total) by mouth every 8 (eight) hours as needed for nausea or vomiting.   SUMAtriptan 100 MG tablet Commonly known as: IMITREX TAKE 1 TABLET ON ONSET OF MIGRAINE HEADACHE, MAY REPEAT X1 IN 2 HOURSIF NEEDED.   Syeda 3-0.03 MG tablet Generic drug: drospirenone-ethinyl estradiol Take 1 tablet by mouth daily.   tiZANidine 2 MG tablet Commonly known as: ZANAFLEX Take 1 tablet twice per day for neck and head pain   valACYclovir 1000 MG tablet Commonly known as: VALTREX TK  ON DAY OF SYMPTOMS TAKE 2 IN THE MORNING AND 2 IN THE EVENING FOR JUST THAT DAY TO STOP OUTBREAK        Total time spent with the patient was 20 minutes, of which 50% or more was spent in counseling and coordination of care.  Elveria Rising NP-C [facility] Ph. [phone] Fax [phone]

## 2019-08-26 ENCOUNTER — Encounter (INDEPENDENT_AMBULATORY_CARE_PROVIDER_SITE_OTHER): Payer: Self-pay | Admitting: Family

## 2019-08-26 DIAGNOSIS — G43829 Menstrual migraine, not intractable, without status migrainosus: Secondary | ICD-10-CM | POA: Insufficient documentation

## 2019-08-26 NOTE — Patient Instructions (Signed)
Thank you for coming in today.   Instructions for you until your next appointment are as follows: 1. I sent in a new prescription for Cymbalta 30mg . Let me know if the increase in dose does not help with the headaches that you are experiencing at this time.  2. I will contact your insurance to try to get coverage for the Frova for the menstrual migraines 3. Remember that it is important for you to drink plenty of water each day, to avoid skipping meals and to get at least 8 hours of sleep each night as these things can help to reduce headaches in general 4. Please sign up for MyChart if you have not done so 5. Please plan to return for follow up in 6 months or sooner if needed.

## 2019-08-27 DIAGNOSIS — Z23 Encounter for immunization: Secondary | ICD-10-CM | POA: Diagnosis not present

## 2019-09-27 DIAGNOSIS — Z23 Encounter for immunization: Secondary | ICD-10-CM | POA: Diagnosis not present

## 2019-10-12 ENCOUNTER — Telehealth (INDEPENDENT_AMBULATORY_CARE_PROVIDER_SITE_OTHER): Payer: Self-pay | Admitting: Family

## 2019-10-12 DIAGNOSIS — G43009 Migraine without aura, not intractable, without status migrainosus: Secondary | ICD-10-CM

## 2019-10-12 MED ORDER — SUMATRIPTAN SUCCINATE 100 MG PO TABS: ORAL_TABLET | ORAL | 3 refills | Status: DC

## 2019-10-12 NOTE — Telephone Encounter (Signed)
°  Who's calling (name and relationship to patient) :  Jozalyn (self)  Best contact number:  [phone]  Provider they see:  Elveria Rising  Reason for call:  Would like a refill of sumatriptan sent to pharmacy.     PRESCRIPTION REFILL ONLY  Name of prescription:  Sumatriptan  Pharmacy:  [facility], [address] in [place].

## 2019-10-12 NOTE — Telephone Encounter (Signed)
Please send to the pharmacy °

## 2019-10-12 NOTE — Telephone Encounter (Signed)
Sent electronically TG 

## 2020-01-26 ENCOUNTER — Telehealth (INDEPENDENT_AMBULATORY_CARE_PROVIDER_SITE_OTHER): Payer: Self-pay | Admitting: Family

## 2020-01-26 MED ORDER — DULOXETINE HCL 20 MG PO CPEP: ORAL_CAPSULE | ORAL | 1 refills | Status: DC

## 2020-01-26 NOTE — Telephone Encounter (Signed)
Who's calling (name and relationship to patient) : Whitney Morgan self  Best contact number: [phone]  Provider they see: Elveria Rising  Reason for call: Patient would like to know if Elveria Rising could change the Duloxetine prescription for her. Patient would like to go back down to 20 mg  Call ID:      PRESCRIPTION REFILL ONLY  Name of prescription:  Pharmacy:

## 2020-01-26 NOTE — Telephone Encounter (Signed)
I called and left Whitney Morgan a message that I will send in new Rx for Duloxetine 20mg . I invited her to call back if she has questions. TG

## 2020-02-18 DIAGNOSIS — Z0000 Encounter for general adult medical examination without abnormal findings: Secondary | ICD-10-CM | POA: Diagnosis not present

## 2020-02-18 DIAGNOSIS — R002 Palpitations: Secondary | ICD-10-CM | POA: Diagnosis not present

## 2020-02-18 DIAGNOSIS — R194 Change in bowel habit: Secondary | ICD-10-CM | POA: Diagnosis not present

## 2020-02-18 DIAGNOSIS — Z6824 Body mass index (BMI) 24.0-24.9, adult: Secondary | ICD-10-CM | POA: Diagnosis not present

## 2020-02-18 DIAGNOSIS — R319 Hematuria, unspecified: Secondary | ICD-10-CM | POA: Diagnosis not present

## 2020-02-18 DIAGNOSIS — Z13220 Encounter for screening for lipoid disorders: Secondary | ICD-10-CM | POA: Diagnosis not present

## 2020-02-18 DIAGNOSIS — L603 Nail dystrophy: Secondary | ICD-10-CM | POA: Diagnosis not present

## 2020-02-18 DIAGNOSIS — F411 Generalized anxiety disorder: Secondary | ICD-10-CM | POA: Diagnosis not present

## 2020-02-28 ENCOUNTER — Telehealth (INDEPENDENT_AMBULATORY_CARE_PROVIDER_SITE_OTHER): Payer: BC Managed Care – PPO | Admitting: Family

## 2020-03-06 ENCOUNTER — Other Ambulatory Visit: Payer: Self-pay

## 2020-03-06 ENCOUNTER — Telehealth (INDEPENDENT_AMBULATORY_CARE_PROVIDER_SITE_OTHER): Payer: BC Managed Care – PPO | Admitting: Family

## 2020-03-06 ENCOUNTER — Encounter (INDEPENDENT_AMBULATORY_CARE_PROVIDER_SITE_OTHER): Payer: Self-pay | Admitting: Family

## 2020-03-06 VITALS — Ht 64.0 in | Wt 142.0 lb

## 2020-03-06 DIAGNOSIS — G44219 Episodic tension-type headache, not intractable: Secondary | ICD-10-CM | POA: Diagnosis not present

## 2020-03-06 DIAGNOSIS — G43829 Menstrual migraine, not intractable, without status migrainosus: Secondary | ICD-10-CM

## 2020-03-06 DIAGNOSIS — G43009 Migraine without aura, not intractable, without status migrainosus: Secondary | ICD-10-CM

## 2020-03-06 DIAGNOSIS — M546 Pain in thoracic spine: Secondary | ICD-10-CM

## 2020-03-06 MED ORDER — DULOXETINE HCL 30 MG PO CPEP: 30.0000 mg | ORAL_CAPSULE | Freq: Every day | ORAL | 5 refills | Status: DC

## 2020-03-06 NOTE — Patient Instructions (Signed)
Thank you for meeting with me by video visit today.   Instructions for you until your next appointment are as follows: 1. Call me in 2-3 weeks to let me know how you are doing on the Cymbalta 30mg  2. Work on managing stress as we discussed today 3. Please sign up for MyChart if you have not done so 4. Please plan to return for follow up in 6 months or sooner if needed.

## 2020-03-06 NOTE — Progress Notes (Signed)
This is a Pediatric Specialist E-Visit follow up consult provided via [facility] [place] consented to an E-Visit consult today.  Location of patient: Whitney Morgan is at home Location of provider: Damita Dunnings is at office Patient was referred by Gweneth Dimitri, MD   The following participants were involved in this E-Visit: NP and patient  Chief Complain/ Reason for E-Visit today: migraines Total time on call: 15 min Follow up: 6 months  Whitney Morgan   MRN:  [id number]  [birth year]   Provider: Elveria Rising NP-C Location of Care: [facility] Child Neurology  Visit type: Routine Follow-Up  Last visit: 08/25/2019  Referral source: Selena Batten, MD History from: patient, [facility] Chart  Brief history:  Copied from previous record: History of tension and migraine headaches, neck and upper back pain. She has experienced intermittent episodes of bilateral leg numbness. She has been taking and tolerating Amitriptyline for migraine prevention. Unfortunately she has not tolerated doses greater than 20mg  per day. She is also taking Magnesium for migraine prevention. She is taking Cymbalta, which has helped with both migraines and back pain.   Today's concerns: Whitney Morgan tells me today that she has had improvement in migraines and now only tends to have a migraine with her menstrual cycle. She decreased the dose of Cymbalta a few weeks ago and has felt somewhat down since then. She admits that working from home and being generally socially isolated due to the Covid 19 pandemic has been difficult for her.   Whitney Morgan reports that she is engaged to be married in June 2022. She has been otherwise generally healthy since she was last seen. Whitney Morgan has no other health concerns today other than previously mentioned.  Review of systems: Please see HPI for neurologic and other pertinent review of systems. Otherwise all other systems were reviewed and were negative.  Problem  List: Patient Active Problem List   Diagnosis Date Noted  . Menstrual migraine without status migrainosus, not intractable 08/26/2019  . Thoracic back pain 12/29/2018  . Bilateral leg pain 08/07/2018  . Migraine without aura 03/28/2013  . Episodic tension type headache 03/28/2013     Past Medical History:  Diagnosis Date  . Anxiety 2014  . Concussion with no loss of consciousness    September 2011 and September 2014  . Episodic tension type headache   . Migraine without aura, without mention of intractable migraine without mention of status migrainosus   . Post concussion syndrome     Past medical history comments: See HPI Copied from previous record: [name] also has history of concussion and post concussion syndrome in September, 2011, with migraines developing after the closed head injury. She had a second head injury in September 2014 when she collided with another player during soccer. She had headaches and vomiting afterwards, then ongoing headaches, neck and shoulder soreness for several weeks afterwards. [name] was taking and tolerating Depakote ER for migraine prevention but tapered off it in January 2017 because of improvement in her migraines.  Surgical history: Past Surgical History:  Procedure Laterality Date  . TONSILLECTOMY  2006  . TYMPANOSTOMY TUBE PLACEMENT     age 26 months     Family history: family history includes Anxiety disorder in her father; Crohn's disease in her father; Migraines in her brother.   Social history: Social History   Socioeconomic History  . Marital status: Single    Spouse name: Not on file  . Number of children: Not on file  . Years of education: Not  on file  . Highest education level: Not on file  Occupational History  . Not on file  Tobacco Use  . Smoking status: Never Smoker  . Smokeless tobacco: Never Used  Substance and Sexual Activity  . Alcohol use: Yes    Comment: Occassionally/Socially  . Drug use: No  . Sexual  activity: Never    Birth control/protection: Pill  Other Topics Concern  . 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   She is working at [employer]. She lives alone.  She has a younger brother that lives with her parents.    Social Determinants of Health   Financial Resource Strain:   . Difficulty of Paying Living Expenses: Not on file  Food Insecurity:   . Worried About Programme researcher, broadcasting/film/video in the Last Year: Not on file  . Ran Out of Food in the Last Year: Not on file  Transportation Needs:   . Lack of Transportation (Medical): Not on file  . Lack of Transportation (Non-Medical): Not on file  Physical Activity:   . Days of Exercise per Week: Not on file  . Minutes of Exercise per Session: Not on file  Stress:   . Feeling of Stress : Not on file  Social Connections:   . Frequency of Communication with Friends and Family: Not on file  . Frequency of Social Gatherings with Friends and Family: Not on file  . Attends Religious Services: Not on file  . Active Member of Clubs or Organizations: Not on file  . Attends Banker Meetings: Not on file  . Marital Status: Not on file  Intimate Partner Violence:   . Fear of Current or Ex-Partner: Not on file  . Emotionally Abused: Not on file  . Physically Abused: Not on file  . Sexually Abused: Not on file    Past/failed meds: Amitriptyline - ineffective Depakote ER - gave improvement in migraines Tizandine - side effects  Allergies: Allergies  Allergen Reactions  . Abreva [Docosanol]   . Neosporin [Neomycin-Bacitracin Zn-Polymyx]   . Sulfa Antibiotics     Rash, fever, and muscle pain  . Sulfasalazine     Rash, fever, and muscle pain  . Benzalkonium Chloride Rash and Swelling    Immunizations: Immunization History  Administered Date(s) Administered  . Moderna SARS-COVID-2 Vaccination 08/27/2019, 09/27/2019      Diagnostics/Screenings: 11/21/2018 - MRI thoracic spine - negative  07/29/14 - Lumbar spine series -No acute fracture, subluxation, or endplate erosion. 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 A right-sided defect is not clearly visible. No slip; mild, relative L5-S1 disc narrowing is likely developmental given absence of endplate degenerative change.  02/24/2010 - CT head wo contrast - normal  08/30/05 - MR pelvis w wo contrast -1. Subcentimeter focus of edema and enhancement in the soft tissues at the tip of the coccyx that could be a small focus of inflammation. No gross abcess or drainable collection. No deep space involvement. 2. Known pars defects at L5 with 1 or 2 mm of anterolisthesis and a minimal bulge of the L5-S1.  01/18/05 - Bone scan -1. 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 2. Otherwise no significant abnormality.  Physical Exam: Ht 5\' 4"  (1.626 m)   Wt 142 lb (64.4 kg)   BMI 24.37 kg/m   General: Well developed, well nourished young woman, seated at home, in no evident distress, sandy hair,  brown eyes, right handed Head: Head normocephalic and atraumatic. Neck: Supple Musculoskeletal: No obvious deformities or scoliosis Skin: No rashes or neurocutaneous lesions  Neurologic Exam Mental Status: Awake and fully alert.  Oriented to place and time.  Recent and remote memory intact.  Attention span, concentration, and fund of knowledge appropriate.  Mood and affect appropriate. Cranial Nerves: Extraocular movements full without nystagmus. Hearing intact and symmetric to voice.  Facial sensation intact.  Face and tongue move normally and symmetrically.  Neck flexion and extension normal. Motor: Normal functional bulk, tone and strength Sensory: Intact to touch and temperature in all extremities.  Coordination: Finger-to-nose performed accurately bilaterally. Gait and Station: Arises from chair without difficulty.  Stance is normal.  Gait demonstrates normal stride length and balance.  Impression: 1. Menstrual migraines 2. Migraine without aura 3. Tension headaches 4. Thoracic back pain  Recommendations for plan of care: [facility] [name] has neither had nor required imaging or lab studies since the last visit. She is a 24 year old young woman with history of menstrual migraine, migraine without aura, tension headaches and thoracic back pain. She is taking and tolerating Cymbalta, which has given her improvement in symptoms. I recommended a small increase in dose and asked Malasia to call me in 2-3 weeks to let me know how she is doing. We talked about managing stress and dealing with social isolation. I will otherwise see her back in follow up in 6 months or sooner if needed. Nekia agreed with the plans made today.   The medication list was reviewed and reconciled. I reviewed changes that were made in the prescribed medications today. A complete medication list was provided to the patient.  Allergies as of 03/06/2020      Reactions   Abreva [docosanol]    Neosporin [neomycin-bacitracin Zn-polymyx]    Sulfa Antibiotics    Rash, fever, and muscle pain   Sulfasalazine    Rash, fever, and muscle pain   Benzalkonium Chloride Rash, Swelling      Medication List       Accurate as of March 06, 2020 11:59 PM. If you have any questions, ask your nurse or doctor.        STOP taking these medications   cyclobenzaprine 10 MG tablet Commonly known as: FLEXERIL Stopped by: Elveria Risingina Gabrial Poppell, NP   frovatriptan 2.5 MG tablet Commonly known as: FROVA Stopped by: Elveria Risingina Smith Potenza, NP     TAKE these medications   DULoxetine 30 MG capsule Commonly known as: Cymbalta Take 1 capsule (30 mg total) by mouth daily. What changed:   medication strength  how much to take  how to take this  when to take this  additional instructions Changed by: Elveria Risingina Faye Sanfilippo, NP     estradiol 0.025 mg/24hr patch Commonly known as: CLIMARA - Dosed in mg/24 hr Place onto the skin.   EXCEDRIN PO Take 400 mg by mouth. Takes as needed   mupirocin ointment 2 % Commonly known as: BACTROBAN APP EXT AA TID   ondansetron 4 MG tablet Commonly known as: Zofran Take 1 tablet (4 mg total) by mouth every 8 (eight) hours as needed for nausea or vomiting.   SUMAtriptan 100 MG tablet Commonly known as: IMITREX TAKE 1 TABLET ON ONSET OF MIGRAINE HEADACHE, MAY REPEAT X1 IN 2 HOURSIF NEEDED.   Syeda 3-0.03 MG tablet Generic drug: drospirenone-ethinyl estradiol Take 1 tablet by mouth daily.   tiZANidine 2 MG tablet Commonly known as: ZANAFLEX Take  1 tablet twice per day for neck and head pain   valACYclovir 1000 MG tablet Commonly known as: VALTREX TK  ON DAY OF SYMPTOMS TAKE 2 IN THE MORNING AND 2 IN THE EVENING FOR JUST THAT DAY TO STOP OUTBREAK       Total time spent with the patient was 15 minutes, of which 50% or more was spent in counseling and coordination of care.  Elveria Rising NP-C [facility] Child [facility]rology Ph. [phone] Fax [phone]

## 2020-03-08 ENCOUNTER — Encounter (INDEPENDENT_AMBULATORY_CARE_PROVIDER_SITE_OTHER): Payer: Self-pay | Admitting: Family

## 2020-03-27 DIAGNOSIS — R197 Diarrhea, unspecified: Secondary | ICD-10-CM | POA: Diagnosis not present

## 2020-03-27 DIAGNOSIS — R1084 Generalized abdominal pain: Secondary | ICD-10-CM | POA: Diagnosis not present

## 2020-03-27 DIAGNOSIS — R109 Unspecified abdominal pain: Secondary | ICD-10-CM | POA: Diagnosis not present

## 2020-05-26 DIAGNOSIS — Z3040 Encounter for surveillance of contraceptives, unspecified: Secondary | ICD-10-CM | POA: Diagnosis not present

## 2020-05-26 DIAGNOSIS — Z6824 Body mass index (BMI) 24.0-24.9, adult: Secondary | ICD-10-CM | POA: Diagnosis not present

## 2020-05-26 DIAGNOSIS — G43829 Menstrual migraine, not intractable, without status migrainosus: Secondary | ICD-10-CM | POA: Diagnosis not present

## 2020-05-26 DIAGNOSIS — Z01419 Encounter for gynecological examination (general) (routine) without abnormal findings: Secondary | ICD-10-CM | POA: Diagnosis not present

## 2020-05-30 DIAGNOSIS — L669 Cicatricial alopecia, unspecified: Secondary | ICD-10-CM | POA: Diagnosis not present

## 2020-05-30 DIAGNOSIS — L301 Dyshidrosis [pompholyx]: Secondary | ICD-10-CM | POA: Diagnosis not present

## 2020-05-30 DIAGNOSIS — L603 Nail dystrophy: Secondary | ICD-10-CM | POA: Diagnosis not present

## 2020-06-27 DIAGNOSIS — D122 Benign neoplasm of ascending colon: Secondary | ICD-10-CM | POA: Diagnosis not present

## 2020-06-27 DIAGNOSIS — K635 Polyp of colon: Secondary | ICD-10-CM | POA: Diagnosis not present

## 2020-06-27 DIAGNOSIS — R197 Diarrhea, unspecified: Secondary | ICD-10-CM | POA: Diagnosis not present

## 2020-06-27 DIAGNOSIS — D124 Benign neoplasm of descending colon: Secondary | ICD-10-CM | POA: Diagnosis not present

## 2020-06-27 DIAGNOSIS — K6389 Other specified diseases of intestine: Secondary | ICD-10-CM | POA: Diagnosis not present

## 2020-07-20 ENCOUNTER — Other Ambulatory Visit (INDEPENDENT_AMBULATORY_CARE_PROVIDER_SITE_OTHER): Payer: Self-pay | Admitting: Family

## 2020-07-20 DIAGNOSIS — G43809 Other migraine, not intractable, without status migrainosus: Secondary | ICD-10-CM

## 2020-07-20 MED ORDER — TIZANIDINE HCL 2 MG PO TABS: ORAL_TABLET | ORAL | 2 refills | Status: AC

## 2020-07-20 NOTE — Telephone Encounter (Signed)
  Who's calling (name and relationship to patient) : Geologist, engineering ( Self)  Best contact number: [phone]  Provider they see: Tina/ [place]  Reason for call: Patient called to see if she could have her Tizanidine refilled she said in her voicemail message that she is having the muscular headaches again. Pateint asked if we can send to the pharmacy below      PRESCRIPTION REFILL ONLY  Name of prescription:Tizanidine  Pharmacy: [facility]  [address] [place]

## 2020-07-20 NOTE — Telephone Encounter (Signed)
This medication has not been sent in since 2020. Please send if applicable

## 2020-08-10 DIAGNOSIS — D122 Benign neoplasm of ascending colon: Secondary | ICD-10-CM | POA: Diagnosis not present

## 2020-08-10 DIAGNOSIS — K648 Other hemorrhoids: Secondary | ICD-10-CM | POA: Diagnosis not present

## 2020-08-10 DIAGNOSIS — K635 Polyp of colon: Secondary | ICD-10-CM | POA: Diagnosis not present

## 2020-10-03 ENCOUNTER — Telehealth (INDEPENDENT_AMBULATORY_CARE_PROVIDER_SITE_OTHER): Payer: Self-pay | Admitting: Family

## 2020-10-03 ENCOUNTER — Other Ambulatory Visit (INDEPENDENT_AMBULATORY_CARE_PROVIDER_SITE_OTHER): Payer: Self-pay | Admitting: Family

## 2020-10-03 DIAGNOSIS — G43009 Migraine without aura, not intractable, without status migrainosus: Secondary | ICD-10-CM

## 2020-10-03 DIAGNOSIS — M546 Pain in thoracic spine: Secondary | ICD-10-CM

## 2020-10-03 MED ORDER — DULOXETINE HCL 30 MG PO CPEP: 30.0000 mg | ORAL_CAPSULE | Freq: Every day | ORAL | 0 refills | Status: DC

## 2020-10-03 NOTE — Telephone Encounter (Signed)
Who's calling (name and relationship to patient) : Michaelle Copas   Best contact number: [phone]  Provider they see: Elveria Rising   Reason for call: Patient only has three pills left  Call ID:      PRESCRIPTION REFILL ONLY  Name of prescription: Duloxetine  Pharmacy: [facility] [address]

## 2020-10-03 NOTE — Telephone Encounter (Signed)
Please let Nekesha know that I sent in a refill for the Duloxetine. Thanks, TG

## 2020-10-03 NOTE — Telephone Encounter (Signed)
Patient is aware 

## 2020-10-15 ENCOUNTER — Encounter (INDEPENDENT_AMBULATORY_CARE_PROVIDER_SITE_OTHER): Payer: Self-pay

## 2020-10-16 ENCOUNTER — Ambulatory Visit (INDEPENDENT_AMBULATORY_CARE_PROVIDER_SITE_OTHER): Payer: BC Managed Care – PPO | Admitting: Family

## 2020-10-19 ENCOUNTER — Encounter (INDEPENDENT_AMBULATORY_CARE_PROVIDER_SITE_OTHER): Payer: Self-pay | Admitting: Family

## 2020-10-19 ENCOUNTER — Ambulatory Visit (INDEPENDENT_AMBULATORY_CARE_PROVIDER_SITE_OTHER): Payer: BC Managed Care – PPO | Admitting: Family

## 2020-10-19 ENCOUNTER — Other Ambulatory Visit: Payer: Self-pay

## 2020-10-19 VITALS — BP 110/74 | HR 76 | Ht 64.0 in | Wt 145.4 lb

## 2020-10-19 DIAGNOSIS — G43009 Migraine without aura, not intractable, without status migrainosus: Secondary | ICD-10-CM | POA: Diagnosis not present

## 2020-10-19 DIAGNOSIS — M546 Pain in thoracic spine: Secondary | ICD-10-CM | POA: Diagnosis not present

## 2020-10-19 DIAGNOSIS — G44219 Episodic tension-type headache, not intractable: Secondary | ICD-10-CM | POA: Diagnosis not present

## 2020-10-19 MED ORDER — VALACYCLOVIR HCL 1 G PO TABS: ORAL_TABLET | ORAL | 1 refills | Status: AC

## 2020-10-19 MED ORDER — DULOXETINE HCL 30 MG PO CPEP: 30.0000 mg | ORAL_CAPSULE | Freq: Every day | ORAL | 5 refills | Status: DC

## 2020-10-19 NOTE — Progress Notes (Signed)
Whitney Morgan   MRN:  [id number]  [birth year]   Provider: Elveria Rising NP-C Location of Care: [facility] Child Neurology  Visit type: Follow Up  Last visit: 03/06/2020  Referral source: No PCP History from: [facility] Chart and patient  Brief history:  Copied from previous record: History of tension and migraine headaches, neck and upper back pain. Whitney Morgan has experienced intermittent episodes of bilateral leg numbness. Whitney Morgan has been taking and tolerating Amitriptyline for migraine prevention. Unfortunately Whitney Morgan has not tolerated doses greater than 20mg  per day. Whitney Morgan is also taking Magnesium for migraine prevention. Sheis taking Cymbalta, which has helped with both migraines and back pain.  Today's concerns: Whitney Morgan reports today that Whitney Morgan has a migraine headache about once per month, usually associated with her menstrual period. Whitney Morgan is pleased with improvement in her migraine and back pain with Cymbalta.   Whitney Morgan is getting married next month and going to [place] on her honeymoon. Whitney Morgan may be moving to [place] this fall.   Whitney Morgan has been otherwise generally healthy since Whitney Morgan was last seen. Whitney Morgan has no other health concerns today other than previously mentioned.  Review of systems: Please see HPI for neurologic and other pertinent review of systems. Otherwise all other systems were reviewed and were negative.  Problem List: Patient Active Problem List   Diagnosis Date Noted  . Menstrual migraine without status migrainosus, not intractable 08/26/2019  . Thoracic back pain 12/29/2018  . Bilateral leg pain 08/07/2018  . Migraine without aura 03/28/2013  . Episodic tension type headache 03/28/2013     Past Medical History:  Diagnosis Date  . Anxiety 2014  . Concussion with no loss of consciousness    September 2011 and September 2014  . Episodic tension type headache   . Migraine without aura, without mention of intractable migraine without mention of status migrainosus   . Post  concussion syndrome     Past medical history comments: See HPI Copied from previous record: Whitney Morgan also has history of concussion and post concussion syndrome in September, 2011, with migraines developing after the closed head injury. Whitney Morgan had a second head injury in September 2014 when Whitney Morgan collided with another player during soccer. Whitney Morgan had headaches and vomiting afterwards, then ongoing headaches, neck and shoulder soreness for several weeks afterwards. Whitney Morgan was taking and tolerating Depakote ER for migraine prevention but tapered off it in January 2017 because of improvement in her migraines  Surgical history: Past Surgical History:  Procedure Laterality Date  . TONSILLECTOMY  2006  . TYMPANOSTOMY TUBE PLACEMENT     age 59 months     Family history: family history includes Anxiety disorder in her father; Crohn's disease in her father; Migraines in her brother.   Social history: Social History   Socioeconomic History  . Marital status: Single    Spouse name: Not on file  . Number of children: Not on file  . Years of education: Not on file  . Highest education level: Not on file  Occupational History  . Not on file  Tobacco Use  . Smoking status: Never Smoker  . Smokeless tobacco: Never Used  Substance and Sexual Activity  . Alcohol use: Yes    Comment: Occassionally/Socially  . Drug use: No  . Sexual activity: Never    Birth control/protection: Pill  Other Topics Concern  . 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   Whitney Morgan is  working at [employer]. Whitney Morgan lives alone.  Whitney Morgan has a younger brother that lives with her parents.    Social Determinants of Health   Financial Resource Strain: Not on file  Food Insecurity: Not on file  Transportation Needs: Not on file  Physical Activity: Not on file  Stress: Not on file  Social Connections: Not on file  Intimate Partner  Violence: Not on file     Past/failed meds: Copied from previous record: Amitriptyline - ineffective Depakote ER - gave improvement in migraines Tizandine - side effects  Allergies: Allergies  Allergen Reactions  . Abreva [Docosanol]   . Neosporin [Neomycin-Bacitracin Zn-Polymyx]   . Sulfa Antibiotics     Rash, fever, and muscle pain  . Sulfasalazine     Rash, fever, and muscle pain  . Benzalkonium Chloride Rash and Swelling    Immunizations: Immunization History  Administered Date(s) Administered  . Moderna Sars-Covid-2 Vaccination 08/27/2019, 09/27/2019    Diagnostics/Screenings: Copied from previous record: 11/21/2018 - MRI thoracic spine - negative  07/29/14 - Lumbar spine series -No acute fracture, subluxation, or endplate erosion. 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 A right-sided defect is not clearly visible. No slip; mild, relative L5-S1 disc narrowing is likely developmental given absence of endplate degenerative change.  02/24/2010 - CT head wo contrast - normal  08/30/05 - MR pelvis w wo contrast -1. Subcentimeter focus of edema and enhancement in the soft tissues at the tip of the coccyx that could be a small focus of inflammation. No gross abcess or drainable collection. No deep space involvement. 2. Known pars defects at L5 with 1 or 2 mm of anterolisthesis and a minimal bulge of the L5-S1.  01/18/05 - Bone scan -1. 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 2. Otherwise no significant abnormality.  Physical Exam: BP 110/74   Pulse 76   Ht 5\' 4"  (1.626 m)   Wt 145 lb 6.4 oz (66 kg)   BMI 24.96 kg/m   General: Well developed, well nourished young woman, seated on exam table, in no evident distress, sandy hair, brown eyes, right handed Head: Head normocephalic and atraumatic.  Oropharynx benign. Neck: Supple Cardiovascular: Regular rate and rhythm, no murmurs Respiratory: Breath sounds clear to  auscultation Musculoskeletal: No obvious deformities or scoliosis Skin: No rashes or neurocutaneous lesions  Neurologic Exam Mental Status: Awake and fully alert.  Oriented to place and time.  Recent and remote memory intact.  Attention span, concentration, and fund of knowledge appropriate.  Mood and affect appropriate. Cranial Nerves: Fundoscopic exam reveals sharp disc margins.  Pupils equal, briskly reactive to light.  Extraocular movements full without nystagmus.  Visual fields full to confrontation.  Hearing intact and symmetric to finger rub.  Facial sensation intact.  Face tongue, palate move normally and symmetrically.  Neck flexion and extension normal. Motor: Normal bulk and tone. Normal strength in all tested extremity muscles. Sensory: Intact to touch and temperature in all extremities.  Coordination: Rapid alternating movements normal in all extremities.  Finger-to-nose and heel-to shin performed accurately bilaterally.  Romberg negative. Gait and Station: Arises from chair without difficulty.  Stance is normal. Gait demonstrates normal stride length and balance.   Able to heel, toe and tandem walk without difficulty. Reflexes: 1+ and symmetric. Toes downgoing.  Impression: Episodic tension-type headache, not intractable  Migraine without aura and without status migrainosus, not intractable - Plan: DULoxetine (CYMBALTA) 30 MG capsule  Bilateral thoracic back pain, unspecified chronicity - Plan: DULoxetine (CYMBALTA) 30  MG capsule    Recommendations for plan of care: The patient's previous [facility] records were reviewed. Adalae has neither had nor required imaging or lab studies since the last visit. Whitney Morgan is a 25 year old young woman with history of migraine and tension headaches, as well as thoracic back pain. Whitney Morgan is taking and tolerating Cymbalta which has improved both headaches and back pain. Whitney Morgan may be moving to [place] in the fall and I talked with her about transitioning to adult  neurology care at that point. Whitney Morgan agreed with the plans made today.   The medication list was reviewed and reconciled. No changes were made in the prescribed medications today. A complete medication list was provided to the patient  Return in about 6 months (around 04/21/2021).   Allergies as of 10/19/2020      Reactions   Abreva [docosanol]    Neosporin [neomycin-bacitracin Zn-polymyx]    Sulfa Antibiotics    Rash, fever, and muscle pain   Sulfasalazine    Rash, fever, and muscle pain   Benzalkonium Chloride Rash, Swelling      Medication List       Accurate as of Oct 19, 2020 11:59 PM. If you have any questions, ask your nurse or doctor.        DULoxetine 30 MG capsule Commonly known as: Cymbalta Take 1 capsule (30 mg total) by mouth daily.   estradiol 0.025 mg/24hr patch Commonly known as: CLIMARA - Dosed in mg/24 hr Place onto the skin.   EXCEDRIN PO Take 400 mg by mouth. Takes as needed   mupirocin ointment 2 % Commonly known as: BACTROBAN APP EXT AA TID   ondansetron 4 MG tablet Commonly known as: Zofran Take 1 tablet (4 mg total) by mouth every 8 (eight) hours as needed for nausea or vomiting.   SUMAtriptan 100 MG tablet Commonly known as: IMITREX TAKE 1 TABLET ON ONSET OF MIGRAINE HEADACHE, MAY REPEAT X1 IN 2 HOURSIF NEEDED.   Syeda 3-0.03 MG tablet Generic drug: drospirenone-ethinyl estradiol Take 1 tablet by mouth daily.   tiZANidine 2 MG tablet Commonly known as: ZANAFLEX Take 1 tablet twice per day for neck and head pain   valACYclovir 1000 MG tablet Commonly known as: VALTREX ON DAY OF SYMPTOMS TAKE 2 IN THE MORNING AND 2 IN THE EVENING FOR JUST THAT DAY TO STOP OUTBREAK What changed: See the new instructions. Changed by: Elveria Rising, NP       Total time spent with the patient was 15 minutes, of which 50% or more was spent in counseling and coordination of care.  Elveria Rising NP-C [facility] Ph.  [phone] Fax [phone]

## 2020-10-28 ENCOUNTER — Encounter (INDEPENDENT_AMBULATORY_CARE_PROVIDER_SITE_OTHER): Payer: Self-pay | Admitting: Family

## 2020-10-28 NOTE — Patient Instructions (Signed)
Thank you for coming in today.   Instructions for you until your next appointment are as follows: 1. Continue your medications as prescribed.  2. Remember to avoid skipping meals and to hydrate yourself well each day.  3. Please sign up for MyChart if you have not done so. 4. Please plan to return for follow up in 6 months or sooner if needed. Once you know if you are moving to [place], we will work on transitioning your care to an adult neurology practice.   At [facility], we are committed to providing exceptional care. You will receive a patient satisfaction survey through text or email regarding your visit today. Your opinion is important to me. Comments are appreciated.

## 2021-02-07 DIAGNOSIS — F419 Anxiety disorder, unspecified: Secondary | ICD-10-CM | POA: Diagnosis not present

## 2021-02-22 DIAGNOSIS — F419 Anxiety disorder, unspecified: Secondary | ICD-10-CM | POA: Diagnosis not present

## 2021-03-08 DIAGNOSIS — F419 Anxiety disorder, unspecified: Secondary | ICD-10-CM | POA: Diagnosis not present

## 2021-03-11 ENCOUNTER — Other Ambulatory Visit (INDEPENDENT_AMBULATORY_CARE_PROVIDER_SITE_OTHER): Payer: Self-pay | Admitting: Family

## 2021-03-11 DIAGNOSIS — G43009 Migraine without aura, not intractable, without status migrainosus: Secondary | ICD-10-CM

## 2021-04-10 DIAGNOSIS — F411 Generalized anxiety disorder: Secondary | ICD-10-CM | POA: Diagnosis not present

## 2021-04-10 DIAGNOSIS — R0789 Other chest pain: Secondary | ICD-10-CM | POA: Diagnosis not present

## 2021-04-17 DIAGNOSIS — E538 Deficiency of other specified B group vitamins: Secondary | ICD-10-CM | POA: Diagnosis not present

## 2021-04-17 DIAGNOSIS — E039 Hypothyroidism, unspecified: Secondary | ICD-10-CM | POA: Diagnosis not present

## 2021-04-17 DIAGNOSIS — F419 Anxiety disorder, unspecified: Secondary | ICD-10-CM | POA: Diagnosis not present

## 2021-04-17 DIAGNOSIS — R5383 Other fatigue: Secondary | ICD-10-CM | POA: Diagnosis not present

## 2021-04-17 DIAGNOSIS — Z72820 Sleep deprivation: Secondary | ICD-10-CM | POA: Diagnosis not present

## 2021-05-08 ENCOUNTER — Other Ambulatory Visit (INDEPENDENT_AMBULATORY_CARE_PROVIDER_SITE_OTHER): Payer: Self-pay | Admitting: Family

## 2021-05-08 DIAGNOSIS — G43009 Migraine without aura, not intractable, without status migrainosus: Secondary | ICD-10-CM

## 2021-05-08 DIAGNOSIS — M546 Pain in thoracic spine: Secondary | ICD-10-CM

## 2021-05-08 MED ORDER — DULOXETINE HCL 30 MG PO CPEP: 30.0000 mg | ORAL_CAPSULE | Freq: Every day | ORAL | 1 refills | Status: DC

## 2021-05-16 DIAGNOSIS — Z6824 Body mass index (BMI) 24.0-24.9, adult: Secondary | ICD-10-CM | POA: Diagnosis not present

## 2021-05-16 DIAGNOSIS — Z0000 Encounter for general adult medical examination without abnormal findings: Secondary | ICD-10-CM | POA: Diagnosis not present

## 2021-05-29 DIAGNOSIS — Z01419 Encounter for gynecological examination (general) (routine) without abnormal findings: Secondary | ICD-10-CM | POA: Diagnosis not present

## 2021-06-07 IMAGING — MR MRI THORACIC SPINE WITHOUT CONTRAST
4 of 6 series · 15 of 48 positions shown · non-contrast
Comparison: None.

CLINICAL DATA: Upper thoracic back pain for 6 months. Bilateral leg
numbness.

EXAM:
MRI THORACIC SPINE WITHOUT CONTRAST
TECHNIQUE: Multiplanar, multisequence MR imaging of the thoracic spine was
performed. No intravenous contrast was administered.

[Series 5: T2 · sagittal · 4.0mm · 0.38mm/px · 4 of 12 slices shown (1 of 3)]
[im 1/12]
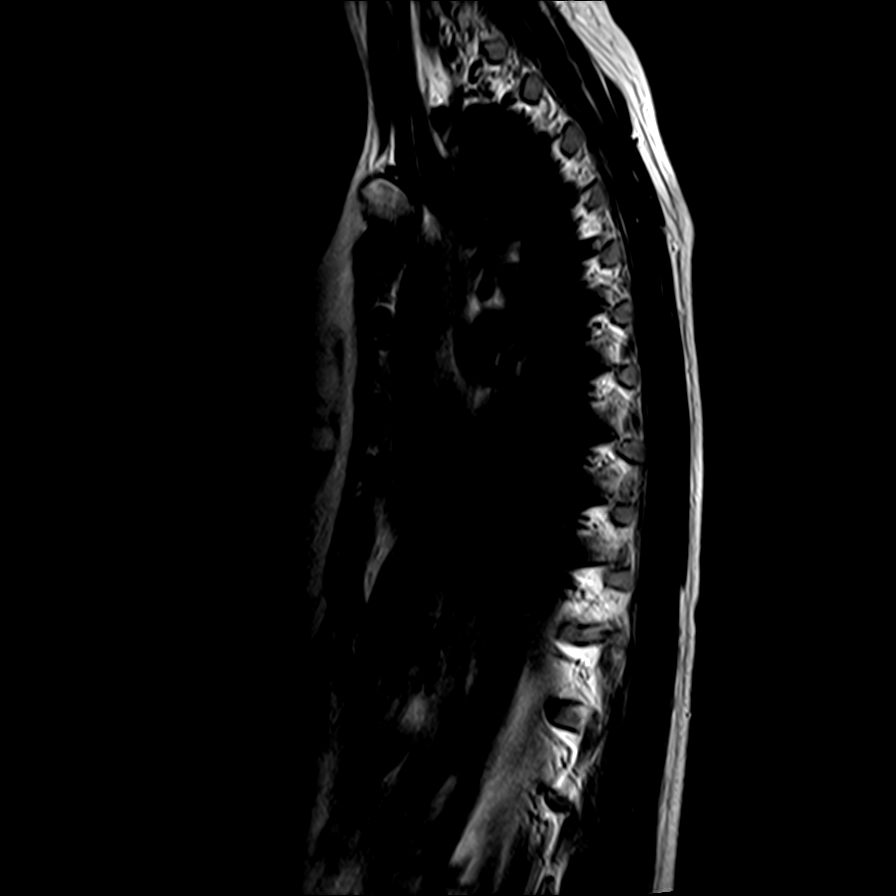
[im 4/12]
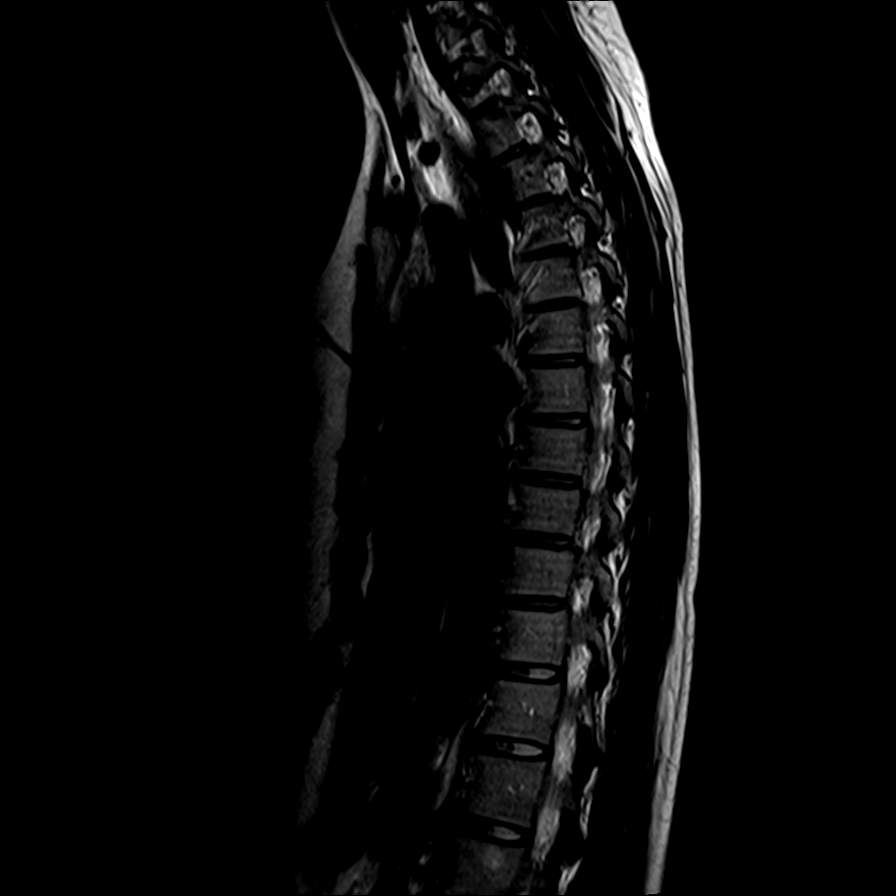
[im 8/12]
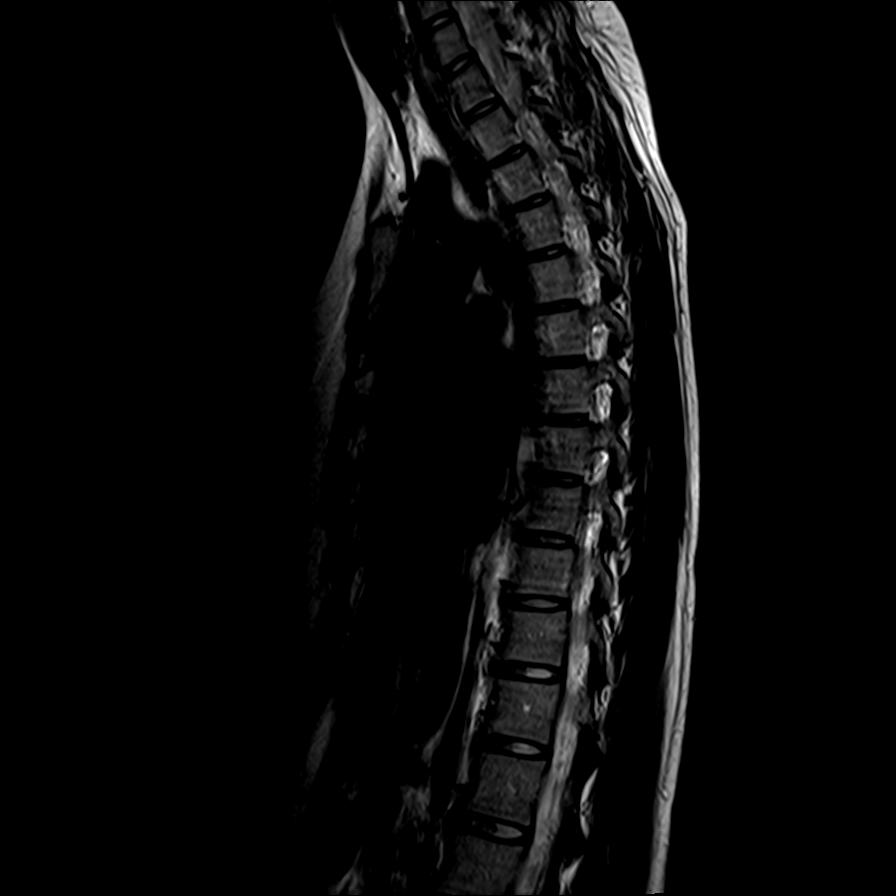
[im 12/12]
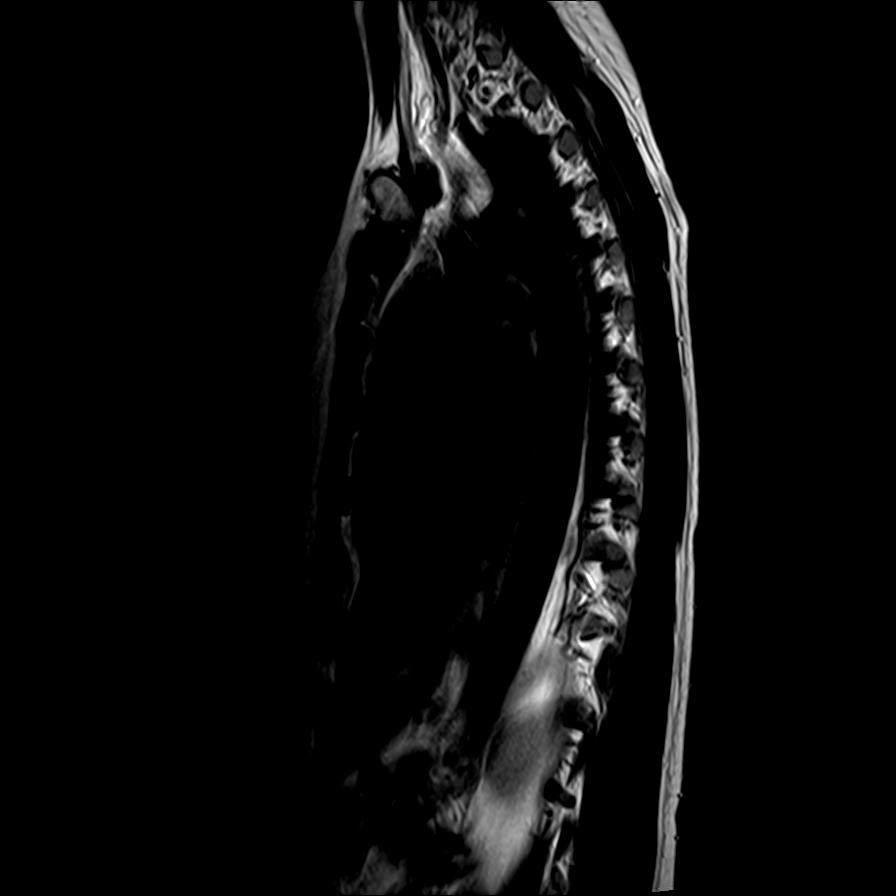

[Series 6: T1 · sagittal · 4.0mm · 0.76mm/px · 3 of 12 slices shown]
[im 1/12]
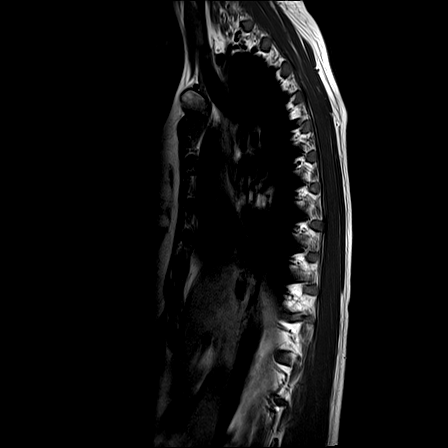
[im 6/12]
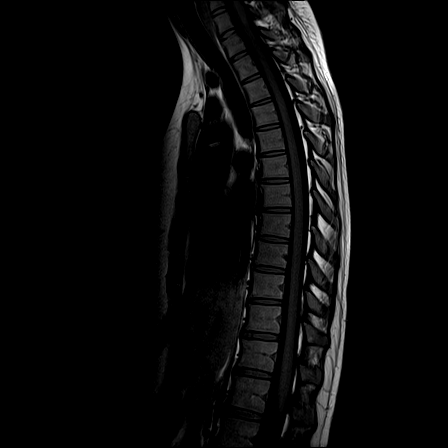
[im 12/12]
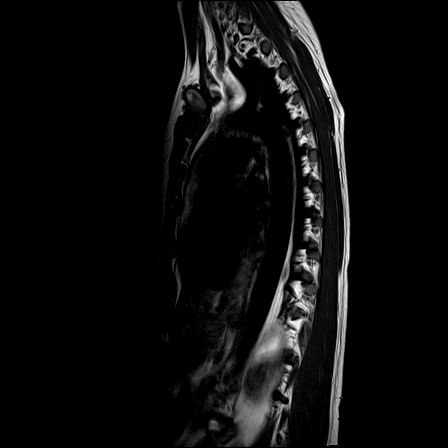

[Series 9: T2 · axial · 4.0mm · 0.39mm/px · z∈[-298,-84]mm · 5 of 42 slices shown (2 of 3)]
[im 3/42]
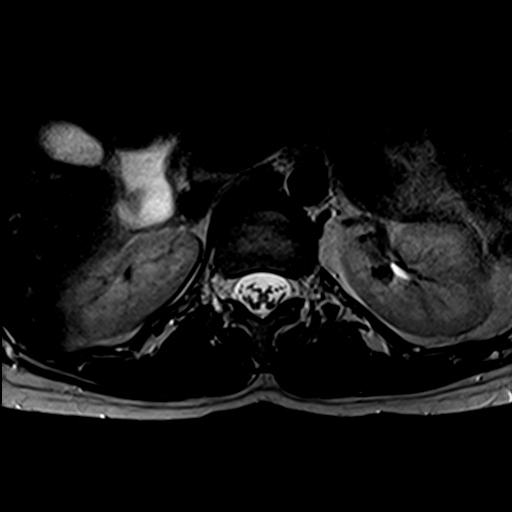
[im 6/42]
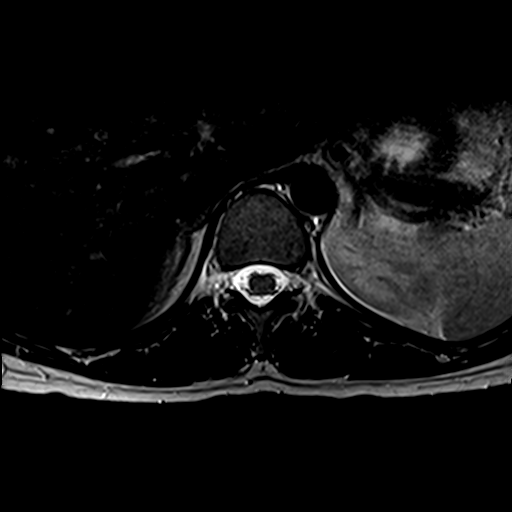
[im 9/42]
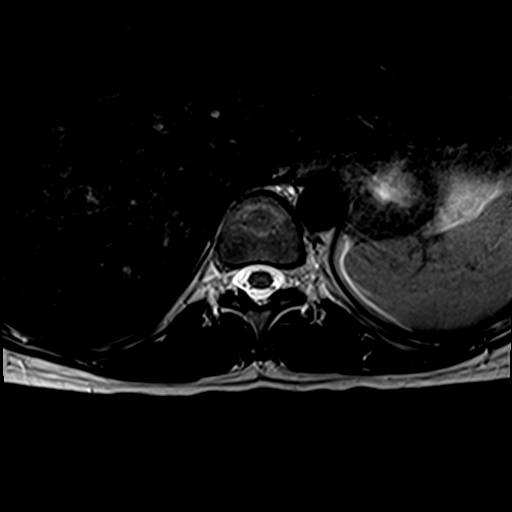
[im 22/42]
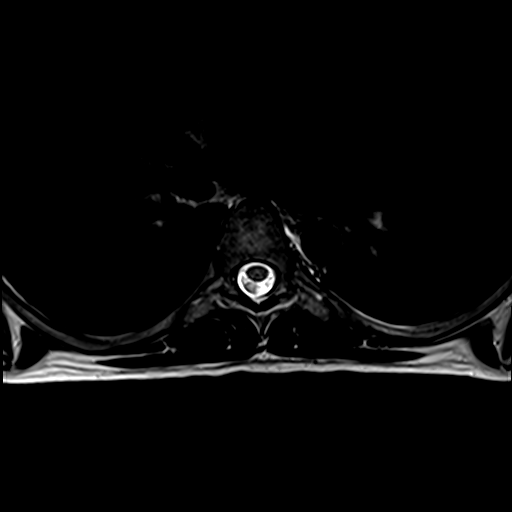
[im 36/42]
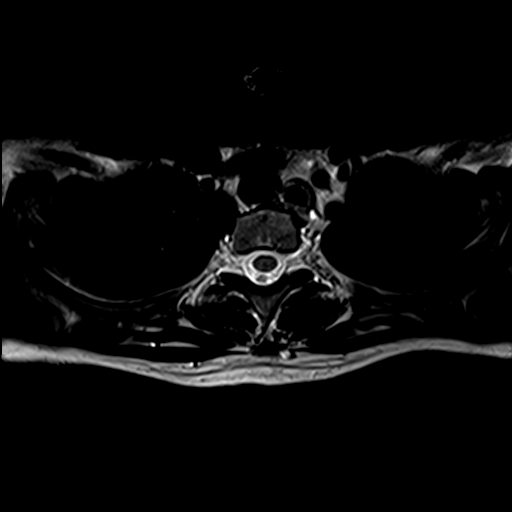

[Series 10: T2 · axial · 4.0mm · 0.52mm/px · z∈[-265,-81]mm · 3 of 42 slices shown (3 of 3)]
[im 6/42]
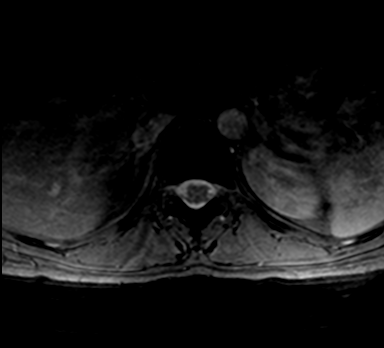
[im 22/42]
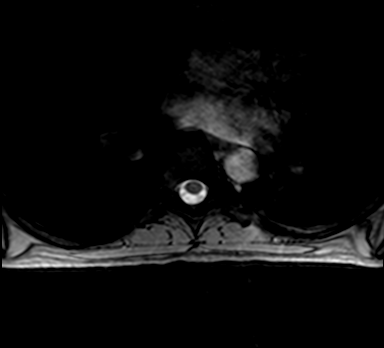
[im 36/42]
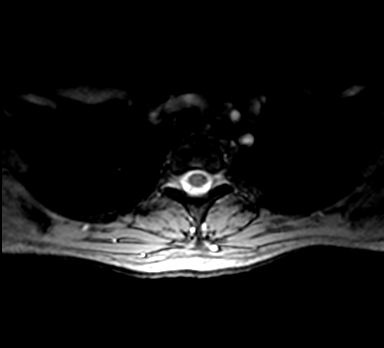

[15 of 48 positions shown; findings below may reference images not displayed]

FINDINGS: Alignment:  Normal.

Vertebrae: No fracture, suspicious osseous lesion, or significant
marrow edema.

Cord: No significant cord signal abnormality. Minimal prominence of
the central canal of the spinal cord at T8-9 without syrinx. Normal
cord size.

Paraspinal and other soft tissues: Unremarkable.

Disc levels:

Preserved disc height and hydration throughout the thoracic spine.
No disc herniation. Widely patent spinal canal and neural foramina.
IMPRESSION: Negative thoracic spine MRI.

## 2021-06-21 ENCOUNTER — Ambulatory Visit (INDEPENDENT_AMBULATORY_CARE_PROVIDER_SITE_OTHER): Payer: BC Managed Care – PPO | Admitting: Family

## 2021-07-25 ENCOUNTER — Ambulatory Visit (INDEPENDENT_AMBULATORY_CARE_PROVIDER_SITE_OTHER): Payer: BC Managed Care – PPO | Admitting: Family

## 2021-08-14 ENCOUNTER — Ambulatory Visit (INDEPENDENT_AMBULATORY_CARE_PROVIDER_SITE_OTHER): Payer: BC Managed Care – PPO | Admitting: Family

## 2021-08-20 ENCOUNTER — Other Ambulatory Visit (INDEPENDENT_AMBULATORY_CARE_PROVIDER_SITE_OTHER): Payer: Self-pay

## 2021-08-20 DIAGNOSIS — M546 Pain in thoracic spine: Secondary | ICD-10-CM

## 2021-08-20 DIAGNOSIS — G43009 Migraine without aura, not intractable, without status migrainosus: Secondary | ICD-10-CM

## 2021-08-20 MED ORDER — DULOXETINE HCL 30 MG PO CPEP: 30.0000 mg | ORAL_CAPSULE | Freq: Every day | ORAL | 1 refills | Status: DC

## 2021-09-11 ENCOUNTER — Ambulatory Visit (INDEPENDENT_AMBULATORY_CARE_PROVIDER_SITE_OTHER): Payer: BC Managed Care – PPO | Admitting: Family

## 2021-10-15 ENCOUNTER — Other Ambulatory Visit (INDEPENDENT_AMBULATORY_CARE_PROVIDER_SITE_OTHER): Payer: Self-pay | Admitting: Family

## 2021-10-15 DIAGNOSIS — M546 Pain in thoracic spine: Secondary | ICD-10-CM

## 2021-10-15 DIAGNOSIS — G43009 Migraine without aura, not intractable, without status migrainosus: Secondary | ICD-10-CM

## 2021-10-17 ENCOUNTER — Telehealth (INDEPENDENT_AMBULATORY_CARE_PROVIDER_SITE_OTHER): Payer: Self-pay | Admitting: Family

## 2021-10-17 NOTE — Telephone Encounter (Signed)
errror

## 2021-11-07 NOTE — Progress Notes (Unsigned)
This is a [facility] E-Visit consult/follow up provided via My Chart Izell CarolinaKatelyn E Kille consented to an E-Visit consult today.  Location of patient: [name] is at work Location of provider: Damita Dunningsina Rockwell Zentz,NP-C is at office Patient was referred by No ref. provider found   The following participants were involved in this E-Visit: CMA, NP, patient (list of participants and their roles)  This visit was done via VIDEO   Chief Complain/ Reason for E-Visit today: headache follow up Total time on call: 15 min Follow up: to transfer care to Adult Neurology provider   Izell CarolinaKatelyn E Ballen   MRN:  [id number]  1995/12/06   Provider: Elveria Risingina Shahiem Bedwell NP-C [facility]  Visit type: Return visit  Last visit: 10/17/2020  History from: Epic chart and patient  Brief history:  Copied from previous record: History of tension and migraine headaches, neck and upper back pain. She has experienced intermittent episodes of bilateral leg numbness. She has been taking and tolerating Amitriptyline for migraine prevention. Unfortunately she has not tolerated doses greater than 20mg  per day. She is also taking Magnesium for migraine prevention. She is taking Cymbalta, which has helped with both migraines and back pain.   Today's concerns: [name] reports today that she continues to experience a migraine headache about once per month, typically associated with her menstrual period but can occur at other times. She is no longer experiencing back or thoracic pain. She asks today if she can taper off Duloxetine as she is interested in becoming pregnant in the next year. She feels that it has been beneficial for anxiety and would like to switch to Sertraline as she has been told that it would be a safer option during pregnancy.   Since [name] was last seen, she [place] gotten [place]. She has been otherwise generally healthy since she was last seen.  She has no other health concerns today other than previously mentioned.  Review of systems: Please see HPI for neurologic and other pertinent review of systems. Otherwise all other systems were reviewed and were negative.  Problem List: Patient Active Problem List   Diagnosis Date Noted   Menstrual migraine without status migrainosus, not intractable 08/26/2019   Thoracic back pain 12/29/2018   Bilateral leg pain 08/07/2018   Migraine without aura 03/28/2013   Episodic tension type headache 03/28/2013     Past Medical History:  Diagnosis Date   Anxiety 2014   Concussion with no loss of consciousness    September 2011 and September 2014   Episodic tension type headache    Migraine without aura, without mention of intractable migraine without mention of status migrainosus    Post concussion syndrome     Past medical history comments: See HPI Copied from previous record: [name] also has history of concussion and post concussion syndrome in September, 2011, with migraines developing after the closed head injury. She had a second head injury in September 2014 when she collided with another player during soccer. She had headaches and vomiting afterwards, then ongoing headaches, neck and shoulder soreness for several weeks afterwards. [name] was taking and tolerating Depakote ER for migraine prevention but tapered off it in January 2017 because of improvement in her migraines  Surgical history: Past Surgical History:  Procedure Laterality Date   TONSILLECTOMY  2006   TYMPANOSTOMY TUBE PLACEMENT     age 639 months     Family history: family history includes Anxiety disorder in her father;  Crohn's disease in her father; Migraines in her brother.   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   She is married.    Social Determinants of Health   Financial Resource Strain: Not on file  Food Insecurity: Not on file  Transportation Needs: Not on file  Physical Activity: Not on file  Stress: Not on file  Social Connections: Not on file  Intimate Partner Violence: Not on file   Past/failed meds: Copied from previous record: Amitriptyline - ineffective Depakote ER - gave improvement in migraines Tizandine - side effects  Allergies: Allergies  Allergen Reactions   Abreva [Docosanol]    Neosporin [Neomycin-Bacitracin Zn-Polymyx]    Sulfa Antibiotics     Rash, fever, and muscle pain   Sulfasalazine     Rash, fever, and muscle pain   Benzalkonium Chloride Rash and Swelling      Immunizations: Immunization History  Administered Date(s) Administered   Moderna Sars-Covid-2 Vaccination 08/27/2019, 09/27/2019      Diagnostics/Screenings: Copied from previous record: 11/21/2018 - MRI thoracic spine - negative   07/29/14 - Lumbar spine series - No acute fracture, subluxation, or endplate erosion. 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 A right-sided defect is not clearly visible. No slip; mild, relative L5-S1 disc narrowing is likely developmental given absence of endplate degenerative change.   02/24/2010 - CT head wo contrast - normal   08/30/05 - MR pelvis w wo contrast - 1. Subcentimeter focus of edema and enhancement in the soft tissues at the tip of the coccyx that could be a small focus of inflammation.  No gross abcess or drainable collection.  No  deep space involvement. 2.  Known pars defects at L5 with 1 or 2 mm of anterolisthesis and a minimal bulge of the L5-S1.   01/18/05 - Bone scan - 1. 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 2. Otherwise no significant abnormality.  Physical Exam: Wt 147 lb (66.7 kg)   BMI 25.23 kg/m   General: Well developed, well nourished woman, seated at her office, in no evident distress Head: Head normocephalic and atraumatic. Neck: Supple Musculoskeletal: No obvious deformities or scoliosis Skin: No rashes or neurocutaneous lesions  Neurologic Exam Mental Status: Awake and fully alert.  Oriented to place and time.  Recent and remote memory intact.  Attention span, concentration, and fund of knowledge appropriate.  Mood and affect appropriate. Cranial Nerves: Extraocular movements full without nystagmus. Hearing intact and symmetric to voice on video.  Facial sensation intact.  Face tongue, palate move normally and symmetrically. Shoulder shrug normal Motor: Normal functional bulk, tone and strength Sensory: Intact to touch and temperature in all extremities.  Coordination: Balance is normal. No dysmetria. Gait and Station: Arises from chair without difficulty.  Stance is normal. Gait demonstrates normal stride length and  balance.    Impression: Migraine without aura and without status migrainosus, not intractable - Plan: Ambulatory referral to Neurology  Generalized anxiety disorder - Plan: DULoxetine (CYMBALTA) 20 MG capsule, Ambulatory referral to Neurology  Menstrual migraine without status migrainosus, not intractable - Plan: Ambulatory referral to Neurology  Episodic tension-type headache, not intractable - Plan: Ambulatory referral to Neurology    Recommendations for plan of care: The patient's previous Epic records were reviewed. Krisandra has neither had nor required imaging or lab studies since the last visit. She is interested in tapering off Duloxetine so that she  can plan a pregnancy and I recommended that the dose reduce to 20mg  for 1 week, then stop it. I asked her to contact me in 2 weeks to let me know she was doing and if she still wants to try Sertraline that I will prescribe it at that time. I also talked with her about transferring her care to an adult neurology provider and she agreed with that plan.  The medication list was reviewed and reconciled. I reviewed the changes that were made in the prescribed medications today. A complete medication list was provided to the patient.  Orders Placed This Encounter  Procedures   Ambulatory referral to Neurology    Referral Priority:   Routine    Referral Type:   Consultation    Referral Reason:   Specialty Services Required    Requested Specialty:   Neurology    Number of Visits Requested:   1    Allergies as of 11/08/2021       Reactions   Abreva [docosanol]    Neosporin [neomycin-bacitracin Zn-polymyx]    Sulfa Antibiotics    Rash, fever, and muscle pain   Sulfasalazine    Rash, fever, and muscle pain   Benzalkonium Chloride Rash, Swelling        Medication List        Accurate as of Nov 07, 2021  3:52 PM. If you have any questions, ask your nurse or doctor.          DULoxetine 30 MG capsule Commonly known as: CYMBALTA TAKE 1 CAPSULE(30 MG) BY MOUTH DAILY   estradiol 0.025 mg/24hr patch Commonly known as: CLIMARA - Dosed in mg/24 hr Place onto the skin.   EXCEDRIN PO Take 400 mg by mouth. Takes as needed   mupirocin ointment 2 % Commonly known as: BACTROBAN APP EXT AA TID   ondansetron 4 MG tablet Commonly known as: Zofran Take 1 tablet (4 mg total) by mouth every 8 (eight) hours as needed for nausea or vomiting.   SUMAtriptan 100 MG tablet Commonly known as: IMITREX TAKE 1 TABLET BY MOUTH AT ONSET OF MIGRAINE HEADACHE. MAY REPEAT 1 TIME IN 2 HOURSIF NEEDED   Syeda 3-0.03 MG tablet Generic drug: drospirenone-ethinyl estradiol Take 1 tablet by mouth daily.    tiZANidine 2 MG tablet Commonly known as: ZANAFLEX Take 1 tablet twice per day for neck and head pain   valACYclovir 1000 MG tablet Commonly known as: VALTREX ON DAY OF SYMPTOMS TAKE 2 IN THE MORNING AND 2 IN THE EVENING FOR JUST THAT DAY TO STOP OUTBREAK      Total time spent with the patient was 15 minutes, of which 50% or more was spent in counseling and coordination of care.  11-14-1995 NP-C [facility] Ph. [phone] Fax [phone]

## 2021-11-08 ENCOUNTER — Telehealth (INDEPENDENT_AMBULATORY_CARE_PROVIDER_SITE_OTHER): Payer: BC Managed Care – PPO | Admitting: Family

## 2021-11-08 ENCOUNTER — Encounter (INDEPENDENT_AMBULATORY_CARE_PROVIDER_SITE_OTHER): Payer: Self-pay | Admitting: Family

## 2021-11-08 VITALS — Wt 147.0 lb

## 2021-11-08 DIAGNOSIS — G43009 Migraine without aura, not intractable, without status migrainosus: Secondary | ICD-10-CM | POA: Diagnosis not present

## 2021-11-08 DIAGNOSIS — G44219 Episodic tension-type headache, not intractable: Secondary | ICD-10-CM | POA: Diagnosis not present

## 2021-11-08 DIAGNOSIS — F411 Generalized anxiety disorder: Secondary | ICD-10-CM | POA: Diagnosis not present

## 2021-11-08 DIAGNOSIS — G43829 Menstrual migraine, not intractable, without status migrainosus: Secondary | ICD-10-CM

## 2021-11-08 MED ORDER — DULOXETINE HCL 20 MG PO CPEP
20.0000 mg | ORAL_CAPSULE | Freq: Every day | ORAL | 0 refills | Status: AC
Start: 2021-11-08 — End: 2021-11-15

## 2021-11-08 NOTE — Patient Instructions (Signed)
It was a pleasure to see you today!  Instructions for you until your next appointment are as follows: Reduce the Duloxetine dose to 20mg  for 7 days, then stop it.  Call or send me a MyChart message after you have been off the Duloxetine for a week. We can consider Zoloft then, depending on how you are doing I will refer you to an adult neurology provider in the [place] area.  Please sign up for MyChart if you have not done so.   Feel free to contact our office during normal business hours at [phone] with questions or concerns. If there is no answer or the call is outside business hours, please leave a message and our clinic staff will call you back within the next business day.  If you have an urgent concern, please stay on the line for our after-hours answering service and ask for the on-call neurologist.     I also encourage you to use MyChart to communicate with me more directly. If you have not yet signed up for MyChart within [facility], the front desk staff can help you. However, please note that this inbox is NOT monitored on nights or weekends, and response can take up to 2 business days.  Urgent matters should be discussed with the on-call pediatric neurologist.   At [facility], we are committed to providing exceptional care. You will receive a patient satisfaction survey through text or email regarding your visit today. Your opinion is important to me. Comments are appreciated.

## 2022-01-03 ENCOUNTER — Other Ambulatory Visit: Payer: Self-pay | Admitting: Obstetrics and Gynecology

## 2022-01-03 DIAGNOSIS — Q51820 Cervical duplication: Secondary | ICD-10-CM

## 2022-01-25 ENCOUNTER — Other Ambulatory Visit: Payer: Self-pay

## 2022-02-25 ENCOUNTER — Other Ambulatory Visit (INDEPENDENT_AMBULATORY_CARE_PROVIDER_SITE_OTHER): Payer: Self-pay

## 2022-02-25 DIAGNOSIS — G43009 Migraine without aura, not intractable, without status migrainosus: Secondary | ICD-10-CM

## 2022-02-25 MED ORDER — SUMATRIPTAN SUCCINATE 100 MG PO TABS: ORAL_TABLET | ORAL | 3 refills | Status: AC
